# Patient Record
Sex: Female | Born: 1990 | Race: White | Hispanic: No | Marital: Married | Smoking: Never smoker
Health system: Southern US, Community
[De-identification: ages and names within clinical notes are randomized; demographics above are authoritative.]

## PROBLEM LIST (undated history)

## (undated) DIAGNOSIS — E119 Type 2 diabetes mellitus without complications: Secondary | ICD-10-CM

---

## 2018-12-30 ENCOUNTER — Other Ambulatory Visit: Payer: Self-pay

## 2018-12-30 ENCOUNTER — Inpatient Hospital Stay (HOSPITAL_COMMUNITY)
Admission: EM | Admit: 2018-12-30 | Discharge: 2018-12-31 | DRG: 639 | Disposition: A | Discharge status: Home / Self Care | Payer: 59 | Attending: Internal Medicine | Admitting: Internal Medicine

## 2018-12-30 ENCOUNTER — Encounter (HOSPITAL_COMMUNITY): Payer: Self-pay | Admitting: Emergency Medicine

## 2018-12-30 ENCOUNTER — Emergency Department (HOSPITAL_COMMUNITY): Payer: 59

## 2018-12-30 DIAGNOSIS — D72829 Elevated white blood cell count, unspecified: Secondary | ICD-10-CM

## 2018-12-30 DIAGNOSIS — Z9641 Presence of insulin pump (external) (internal): Secondary | ICD-10-CM | POA: Diagnosis present

## 2018-12-30 DIAGNOSIS — E1010 Type 1 diabetes mellitus with ketoacidosis without coma: Secondary | ICD-10-CM | POA: Diagnosis present

## 2018-12-30 DIAGNOSIS — Z794 Long term (current) use of insulin: Secondary | ICD-10-CM

## 2018-12-30 DIAGNOSIS — E860 Dehydration: Secondary | ICD-10-CM | POA: Diagnosis present

## 2018-12-30 DIAGNOSIS — Z20828 Contact with and (suspected) exposure to other viral communicable diseases: Secondary | ICD-10-CM | POA: Diagnosis present

## 2018-12-30 DIAGNOSIS — Z79899 Other long term (current) drug therapy: Secondary | ICD-10-CM | POA: Diagnosis not present

## 2018-12-30 DIAGNOSIS — N289 Disorder of kidney and ureter, unspecified: Secondary | ICD-10-CM

## 2018-12-30 DIAGNOSIS — R112 Nausea with vomiting, unspecified: Secondary | ICD-10-CM

## 2018-12-30 DIAGNOSIS — E0810 Diabetes mellitus due to underlying condition with ketoacidosis without coma: Secondary | ICD-10-CM

## 2018-12-30 HISTORY — DX: Type 2 diabetes mellitus without complications: E11.9

## 2018-12-30 LAB — GLUCOSE, CAPILLARY
Glucose-Capillary: 211 mg/dL — ABNORMAL HIGH (ref 70–99)
Glucose-Capillary: 186 mg/dL — ABNORMAL HIGH (ref 70–99)
Glucose-Capillary: 179 mg/dL — ABNORMAL HIGH (ref 70–99)

## 2018-12-30 LAB — CBC
HCT: 43 % (ref 36.0–46.0)
HCT: 39.3 % (ref 36.0–46.0)
Hemoglobin: 13.8 g/dL (ref 12.0–15.0)
Hemoglobin: 12.7 g/dL (ref 12.0–15.0)
MCH: 30.5 pg (ref 26.0–34.0)
MCH: 30.5 pg (ref 26.0–34.0)
MCHC: 32.1 g/dL (ref 30.0–36.0)
MCHC: 32.3 g/dL (ref 30.0–36.0)
MCV: 94.9 fL (ref 80.0–100.0)
MCV: 94.5 fL (ref 80.0–100.0)
Platelets: 303 10*3/uL (ref 150–400)
Platelets: 287 10*3/uL (ref 150–400)
RBC: 4.53 MIL/uL (ref 3.87–5.11)
RBC: 4.16 MIL/uL (ref 3.87–5.11)
RDW: 12.3 % (ref 11.5–15.5)
RDW: 12.3 % (ref 11.5–15.5)
WBC: 17.9 10*3/uL — ABNORMAL HIGH (ref 4.0–10.5)
WBC: 16.1 10*3/uL — ABNORMAL HIGH (ref 4.0–10.5)
nRBC: 0 % (ref 0.0–0.2)
nRBC: 0 % (ref 0.0–0.2)

## 2018-12-30 LAB — BASIC METABOLIC PANEL
Anion gap: 16 — ABNORMAL HIGH (ref 5–15)
Anion gap: 12 (ref 5–15)
BUN: 16 mg/dL (ref 6–20)
BUN: 16 mg/dL (ref 6–20)
CO2: 14 mmol/L — ABNORMAL LOW (ref 22–32)
CO2: 16 mmol/L — ABNORMAL LOW (ref 22–32)
Calcium: 8.5 mg/dL — ABNORMAL LOW (ref 8.9–10.3)
Calcium: 8.3 mg/dL — ABNORMAL LOW (ref 8.9–10.3)
Chloride: 106 mmol/L (ref 98–111)
Chloride: 108 mmol/L (ref 98–111)
Creatinine, Ser: 0.93 mg/dL (ref 0.44–1.00)
Creatinine, Ser: 0.85 mg/dL (ref 0.44–1.00)
GFR calc Af Amer: >60 mL/min (ref >60)
GFR calc Af Amer: >60 mL/min (ref >60)
GFR calc non Af Amer: >60 mL/min (ref >60)
GFR calc non Af Amer: >60 mL/min (ref >60)
Glucose, Bld: 216 mg/dL — ABNORMAL HIGH (ref 70–99)
Glucose, Bld: 197 mg/dL — ABNORMAL HIGH (ref 70–99)
Potassium: 4.1 mmol/L (ref 3.5–5.1)
Potassium: 3.9 mmol/L (ref 3.5–5.1)
Sodium: 136 mmol/L (ref 135–145)
Sodium: 136 mmol/L (ref 135–145)

## 2018-12-30 LAB — URINALYSIS, ROUTINE W REFLEX MICROSCOPIC
Bilirubin Urine: NEGATIVE
Glucose, UA: >=500 mg/dL — AB
Hgb urine dipstick: NEGATIVE
Ketones, ur: 80 mg/dL — AB
Leukocytes,Ua: NEGATIVE
Nitrite: NEGATIVE
Protein, ur: NEGATIVE mg/dL
Specific Gravity, Urine: 1.022 (ref 1.005–1.030)
pH: 5 (ref 5.0–8.0)

## 2018-12-30 LAB — COMPREHENSIVE METABOLIC PANEL
ALT: 35 U/L (ref 0–44)
AST: 39 U/L (ref 15–41)
Albumin: 4.8 g/dL (ref 3.5–5.0)
Alkaline Phosphatase: 81 U/L (ref 38–126)
Anion gap: 23 — ABNORMAL HIGH (ref 5–15)
BUN: 22 mg/dL — ABNORMAL HIGH (ref 6–20)
CO2: 13 mmol/L — ABNORMAL LOW (ref 22–32)
Calcium: 9.2 mg/dL (ref 8.9–10.3)
Chloride: 98 mmol/L (ref 98–111)
Creatinine, Ser: 1.3 mg/dL — ABNORMAL HIGH (ref 0.44–1.00)
GFR calc Af Amer: >60 mL/min (ref >60)
GFR calc non Af Amer: 56 mL/min — ABNORMAL LOW (ref >60)
Glucose, Bld: 471 mg/dL — ABNORMAL HIGH (ref 70–99)
Potassium: 4.8 mmol/L (ref 3.5–5.1)
Sodium: 134 mmol/L — ABNORMAL LOW (ref 135–145)
Total Bilirubin: 2 mg/dL — ABNORMAL HIGH (ref 0.3–1.2)
Total Protein: 8.5 g/dL — ABNORMAL HIGH (ref 6.5–8.1)

## 2018-12-30 LAB — I-STAT BETA HCG BLOOD, ED (MC, WL, AP ONLY): I-stat hCG, quantitative: <5 m[IU]/mL (ref <5)

## 2018-12-30 LAB — CBG MONITORING, ED
Glucose-Capillary: 569 mg/dL (ref 70–99)
Glucose-Capillary: 296 mg/dL — ABNORMAL HIGH (ref 70–99)
Glucose-Capillary: 313 mg/dL — ABNORMAL HIGH (ref 70–99)

## 2018-12-30 LAB — MRSA PCR SCREENING: MRSA by PCR: NEGATIVE

## 2018-12-30 MED ORDER — SODIUM CHLORIDE 0.9 % IV SOLN: INTRAVENOUS | Status: DC

## 2018-12-30 MED ORDER — SODIUM CHLORIDE 0.9 % IV BOLUS
2000.0000 mL | Freq: Once | INTRAVENOUS | Status: AC
  Administered 2018-12-30: 2000 mL via INTRAVENOUS

## 2018-12-30 MED ORDER — CHLORHEXIDINE GLUCONATE CLOTH 2 % EX PADS
6.0000 | MEDICATED_PAD | Freq: Every day | CUTANEOUS | Status: DC
  Administered 2018-12-30: 6 via TOPICAL

## 2018-12-30 MED ORDER — ONDANSETRON HCL 4 MG/2ML IJ SOLN
4.0000 mg | Freq: Once | INTRAMUSCULAR | Status: AC
  Administered 2018-12-30: 4 mg via INTRAVENOUS
  Filled 2018-12-30: qty 2

## 2018-12-30 MED ORDER — DEXTROSE 50 % IV SOLN: 25.0000 mL | INTRAVENOUS | Status: DC | PRN

## 2018-12-30 MED ORDER — HYDROCODONE-ACETAMINOPHEN 5-325 MG PO TABS: 1.0000 | ORAL_TABLET | ORAL | Status: DC | PRN

## 2018-12-30 MED ORDER — DEXTROSE-NACL 5-0.45 % IV SOLN
INTRAVENOUS | Status: DC
  Administered 2018-12-30: 20:00:00 via INTRAVENOUS

## 2018-12-30 MED ORDER — SODIUM CHLORIDE 0.9 % IV SOLN: INTRAVENOUS | Status: AC

## 2018-12-30 MED ORDER — ONDANSETRON HCL 4 MG/2ML IJ SOLN: 4.0000 mg | Freq: Four times a day (QID) | INTRAMUSCULAR | Status: DC | PRN

## 2018-12-30 MED ORDER — ORAL CARE MOUTH RINSE
15.0000 mL | Freq: Two times a day (BID) | OROMUCOSAL | Status: DC
  Administered 2018-12-30 – 2018-12-31 (×2): 15 mL via OROMUCOSAL

## 2018-12-30 MED ORDER — INSULIN REGULAR(HUMAN) IN NACL 100-0.9 UT/100ML-% IV SOLN
INTRAVENOUS | Status: DC
  Administered 2018-12-30: 2.5 [IU]/h via INTRAVENOUS
  Filled 2018-12-30: qty 100

## 2018-12-30 MED ORDER — INSULIN REGULAR BOLUS VIA INFUSION
0.0000 [IU] | Freq: Three times a day (TID) | INTRAVENOUS | Status: DC
  Filled 2018-12-30: qty 10

## 2018-12-30 MED ORDER — ESCITALOPRAM OXALATE 10 MG PO TABS
20.0000 mg | ORAL_TABLET | Freq: Every day | ORAL | Status: DC
  Administered 2018-12-31: 20 mg via ORAL
  Filled 2018-12-30: qty 2

## 2018-12-30 MED ORDER — ONDANSETRON HCL 4 MG PO TABS: 4.0000 mg | ORAL_TABLET | Freq: Four times a day (QID) | ORAL | Status: DC | PRN

## 2018-12-30 MED ORDER — HEPARIN SODIUM (PORCINE) 5000 UNIT/ML IJ SOLN
5000.0000 [IU] | Freq: Three times a day (TID) | INTRAMUSCULAR | Status: DC
  Administered 2018-12-31 (×2): 5000 [IU] via SUBCUTANEOUS
  Filled 2018-12-30 (×2): qty 1

## 2018-12-30 NOTE — ED Triage Notes (Signed)
Pt reports started feeling bad around 5am. Been vomiting, feeling weak, is DM type 1. Reports went to UC and was told to go to ED for possible DKA-sugar was 400s. Pt has insulin pump. Also having headache and back pains.

## 2018-12-30 NOTE — ED Notes (Signed)
Below order not completed by EW. 

## 2018-12-30 NOTE — ED Notes (Signed)
Patient transported to X-ray 

## 2018-12-30 NOTE — ED Provider Notes (Signed)
[place] DEPT Provider Note   CSN: [id number] Arrival date & time: 12/30/18  1451     History   Chief Complaint Chief Complaint  Patient presents with   Chills   Headache   Back Pain    HPI Kendra Tate is a 28 y.o. female.     Patient presents with vomiting and weakness.  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  This started today   Weakness Severity:  Moderate Onset quality:  Sudden Timing:  Constant Chronicity:  New Context: dehydration   Context: not alcohol use   Relieved by:  Nothing Worsened by:  Nothing Ineffective treatments:  None tried Associated symptoms: anorexia and vomiting   Associated symptoms: no abdominal pain, no chest pain, no cough, no diarrhea, no frequency, no headaches and no seizures     Past Medical History:  Diagnosis Date   Diabetes mellitus without complication [facility])     Patient Active Problem List   Diagnosis Date Noted   DKA (diabetic ketoacidoses) ([place]) 12/30/2018    Past Surgical History:  Procedure Laterality Date   CESAREAN SECTION       OB History   No obstetric history on file.      Home Medications    Prior to Admission medications   Medication Sig Start Date End Date Taking? Authorizing Provider  amoxicillin-clavulanate (AUGMENTIN) 500-125 MG tablet Take 1 tablet by mouth 2 (two) times daily. 12/26/18 01/05/19 Yes [provider]  escitalopram (LEXAPRO) 20 MG tablet Take 20 mg by mouth daily. 07/27/18  Yes [provider]  glucagon 1 MG injection Inject 1 mg into the skin as needed. 09/04/15  Yes [provider]  insulin lispro (HUMALOG) 100 UNIT/ML injection Inject 70 Units into the skin daily. Insulin pump 09/20/15  Yes [provider]  Insulin Glargine, 1 Unit Dial, (TOUJEO SOLOSTAR) 300 UNIT/ML SOPN Inject 22 Units into the skin 2 (two) times daily as needed. Only use when Insulin Pump fails. 11/11/18   [provider]      Family History No family history on file.  Social History Social History   Tobacco Use   Smoking status: Not on file  Substance Use Topics   Alcohol use: Not on file   Drug use: Not on file     Allergies   Patient has no allergy information on record.   Review of Systems Review of Systems  Constitutional: Negative for appetite change and fatigue.  HENT: Negative for congestion, ear discharge and sinus pressure.   Eyes: Negative for discharge.  Respiratory: Negative for cough.   Cardiovascular: Negative for chest pain.  Gastrointestinal: Positive for anorexia and vomiting. Negative for abdominal pain and diarrhea.  Genitourinary: Negative for frequency and hematuria.  Musculoskeletal: Negative for back pain.  Skin: Negative for rash.  Neurological: Positive for weakness. Negative for seizures and headaches.  Psychiatric/Behavioral: Negative for hallucinations.     Physical Exam Updated Vital Signs BP 123/73 (BP Location: Left Arm)    Pulse 100    Temp 98.6 F (37 C) (Oral)    Resp 20    Ht 5\' 3"  (1.6 m)    Wt 72.6 kg    LMP 12/15/2018    SpO2 99%    BMI 28.34 kg/m   Physical Exam Vitals signs and nursing note reviewed.  Constitutional:      Appearance: She is well-developed.  HENT:     Head: Normocephalic.     Nose: Nose normal.  Eyes:     General: No scleral icterus.    Conjunctiva/sclera: Conjunctivae normal.  Neck:     Musculoskeletal: Neck supple.     Thyroid: No thyromegaly.  Cardiovascular:     Rate and Rhythm: Normal rate and regular rhythm.     Heart sounds: No murmur. No friction rub. No gallop.   Pulmonary:     Breath sounds: No stridor. No wheezing or rales.  Chest:     Chest wall: No tenderness.  Abdominal:     General: There is no distension.     Tenderness: There is no abdominal tenderness. There is no rebound.  Musculoskeletal: Normal range of motion.  Lymphadenopathy:     Cervical: No cervical adenopathy.  Skin:    Findings:  No erythema or rash.  Neurological:     Mental Status: She is oriented to person, place, and time.     Motor: No abnormal muscle tone.     Coordination: Coordination normal.  Psychiatric:        Behavior: Behavior normal.      ED Treatments / Results  Labs (all labs ordered are listed, but only abnormal results are displayed) Labs Reviewed  CBC - Abnormal; Notable for the following components:      Result Value   WBC 17.9 (*)    All other components within normal limits  URINALYSIS, ROUTINE W REFLEX MICROSCOPIC - Abnormal; Notable for the following components:   Color, Urine STRAW (*)    Glucose, UA >=500 (*)    Ketones, ur 80 (*)    Bacteria, UA RARE (*)    All other components within normal limits  COMPREHENSIVE METABOLIC PANEL - Abnormal; Notable for the following components:   Sodium 134 (*)    CO2 13 (*)    Glucose, Bld 471 (*)    BUN 22 (*)    Creatinine, Ser 1.30 (*)    Total Protein 8.5 (*)    Total Bilirubin 2.0 (*)    GFR calc non Af Amer 56 (*)    Anion gap 23 (*)    All other components within normal limits  CBG MONITORING, ED - Abnormal; Notable for the following components:   Glucose-Capillary 569 (*)    All other components within normal limits  I-STAT BETA HCG BLOOD, ED ([facility], [facility], [facility] ONLY)  I-STAT CHEM 8, ED    EKG EKG Interpretation  Date/Time:  Thursday December 30 2018 15:10:30 EST Ventricular Rate:  118 PR Interval:    QRS Duration: 84 QT Interval:  345 QTC Calculation: 484 R Axis:   93 Text Interpretation: Sinus tachycardia Borderline right axis deviation Borderline prolonged QT interval Baseline wander in lead(s) V6 Confirmed by Geoffery Lyons ([id number]) on 12/30/2018 3:35:37 PM   Radiology No results found.  Procedures Procedures (including critical care time)  Medications Ordered in ED Medications  dextrose 5 %-0.45 % sodium chloride infusion (has no administration in time range)  insulin regular bolus via infusion 0-10 Units (has no  administration in time range)  insulin regular, human (MYXREDLIN) 100 units/ 100 mL infusion (has no administration in time range)  dextrose 50 % solution 25 mL (has no administration in time range)  0.9 %  sodium chloride infusion (has no administration in time range)  sodium chloride 0.9 % bolus 2,000 mL (2,000 mLs Intravenous New Bag/Given 12/30/18 1622)  ondansetron (ZOFRAN) injection 4 mg (4 mg Intravenous Given 12/30/18 1623)     Initial Impression / Assessment and Plan / ED Course  I have reviewed the triage vital signs and the nursing notes.  Pertinent labs & imaging results that were available during my care of the patient were reviewed by me and considered in my medical decision making (see chart for details).        CRITICAL CARE Performed by: Bethann BerkshireJoseph Ondine Gemme Total critical care time: 45 minutes Critical care time was exclusive of separately billable procedures and treating other patients. Critical care was necessary to treat or prevent imminent or life-threatening deterioration. Critical care was time spent personally by me on the following activities: development of treatment plan with patient and/or surrogate as well as nursing, discussions with consultants, evaluation of patient's response to treatment, examination of patient, obtaining history from patient or surrogate, ordering and performing treatments and interventions, ordering and review of laboratory studies, ordering and review of radiographic studies, pulse oximetry and re-evaluation of patient's condition. Patient is in DKA.  We will start her on an insulin drip and Glucomander.  She will be admitted to medicine  Final Clinical Impressions(s) / ED Diagnoses   Final diagnoses:  None    ED Discharge Orders    None       Bethann BerkshireZammit, Timo Hartwig, MD 12/30/18 1746

## 2018-12-30 NOTE — ED Notes (Signed)
Called report to nurse Ubaldo Glassing for pt tranfer upstairs to ICU.

## 2018-12-30 NOTE — H&P (Signed)
History and Physical    Kendra Tate YNW:[id number] DOB: 1990-04-23 DOA: 12/30/2018  PCP: Patient, No Pcp Per   Chief Complaint: Nausea with vomiting  HPI: Kendra Tate is a 28 y.o. female with medical history significant of diabetes mellitus type 1 who presented to the emergency room with multiple episodes of nausea and vomiting.  She says that her symptoms started this morning.  She had some mild epigastric discomfort when she was vomiting.  Denies having any blood in the vomitus.  Denies having any fevers, chest pain, shortness of breath, abdominal pain, diarrhea or dysuria.  She works as an Neurosurgeon person.  She is also having some generalized weakness with no focal deficits. She says last week she had some mild upper respiratory tract symptoms which she thinks has resolved.  She is on insulin pump.  She does not think that her pump malfunctioned. -Covid test pending at this time.  ED Course: Lab work in the ED showed leukocytosis 17.9.  She was afebrile.  Blood glucose 471 with anion gap of 23.  She also had acute renal insufficiency with BUN 22 and creatinine 1.30.  Baseline unknown at this time.  Review of Systems: As per HPI otherwise 10 point review of systems negative.    Past Medical History:  Diagnosis Date  . Diabetes mellitus without complication [facility])     Past Surgical History:  Procedure Laterality Date  . CESAREAN SECTION      Social history: She denies having any history of tobacco abuse or drug abuse.  Occasional alcohol.  Family history: H/o DM   Prior to Admission medications   Medication Sig Start Date End Date Taking? Authorizing Provider  amoxicillin-clavulanate (AUGMENTIN) 500-125 MG tablet Take 1 tablet by mouth 2 (two) times daily. 12/26/18 01/05/19 Yes [provider]  escitalopram (LEXAPRO) 20 MG tablet Take 20 mg by mouth daily. 07/27/18  Yes [provider]  glucagon 1 MG injection Inject 1 mg into the skin as needed. 09/04/15  Yes  [provider]  insulin lispro (HUMALOG) 100 UNIT/ML injection Inject 70 Units into the skin daily. Insulin pump 09/20/15  Yes [provider]  Insulin Glargine, 1 Unit Dial, (TOUJEO SOLOSTAR) 300 UNIT/ML SOPN Inject 22 Units into the skin 2 (two) times daily as needed. Only use when Insulin Pump fails. 11/11/18   [provider]    Physical Exam: Vitals:   12/30/18 1605 12/30/18 1839  BP: 123/73 (!) 110/54  Pulse: 100 94  Resp: 20 20  Temp: 98.6 F (37 C)   TempSrc: Oral   SpO2: 99% 100%  Weight: 72.6 kg   Height: 5\' 3"  (1.6 m)     Vitals:   12/30/18 1605 12/30/18 1839  BP: 123/73 (!) 110/54  Pulse: 100 94  Resp: 20 20  Temp: 98.6 F (37 C)   TempSrc: Oral   SpO2: 99% 100%  Weight: 72.6 kg   Height: 5\' 3"  (1.6 m)    Constitutional: Complaining of nausea.  Not in any acute distress at this time. Eyes: PERRL, lids and conjunctivae normal ENMT: Dry mucous membranes Neck: normal, supple, no masses, no thyromegaly Respiratory: clear to auscultation bilaterally, no wheezing, no crackles. Normal respiratory effort. No accessory muscle use.  Cardiovascular: Regular rate and rhythm, no murmurs / rubs / gallops. No extremity edema.  Abdomen: Soft, nondistended, no tenderness. Bowel sounds positive.  Musculoskeletal: No joint deformity upper and lower extremities. Good ROM, no contractures. Normal muscle tone.  Skin: no rashes Neurologic: She  has some generalized weakness otherwise grossly nonfocal. Strength 5/5 in all 4.  Psychiatric: Normal judgment and insight. Alert and oriented x 3.   Labs on Admission: I have personally reviewed following labs and imaging studies  CBC: Recent Labs  Lab 12/30/18 1605  WBC 17.9*  HGB 13.8  HCT 43.0  MCV 94.9  PLT 303   Basic Metabolic Panel: Recent Labs  Lab 12/30/18 1605  NA 134*  K 4.8  CL 98  CO2 13*  GLUCOSE 471*  BUN 22*  CREATININE 1.30*  CALCIUM 9.2   GFR: Estimated Creatinine Clearance:  61.5 mL/min (A) (by C-G formula based on SCr of 1.3 mg/dL (H)). Liver Function Tests: Recent Labs  Lab 12/30/18 1605  AST 39  ALT 35  ALKPHOS 81  BILITOT 2.0*  PROT 8.5*  ALBUMIN 4.8   No results for input(s): LIPASE, AMYLASE in the last 168 hours. No results for input(s): AMMONIA in the last 168 hours. Coagulation Profile: No results for input(s): INR, PROTIME in the last 168 hours. Cardiac Enzymes: No results for input(s): CKTOTAL, CKMB, CKMBINDEX, TROPONINI in the last 168 hours. BNP (last 3 results) No results for input(s): PROBNP in the last 8760 hours. HbA1C: No results for input(s): HGBA1C in the last 72 hours. CBG: Recent Labs  Lab 12/30/18 1506 12/30/18 1742 12/30/18 1823  GLUCAP 569* 313* 296*   Lipid Profile: No results for input(s): CHOL, HDL, LDLCALC, TRIG, CHOLHDL, LDLDIRECT in the last 72 hours. Thyroid Function Tests: No results for input(s): TSH, T4TOTAL, FREET4, T3FREE, THYROIDAB in the last 72 hours. Anemia Panel: No results for input(s): VITAMINB12, FOLATE, FERRITIN, TIBC, IRON, RETICCTPCT in the last 72 hours. Urine analysis:    Component Value Date/Time   COLORURINE STRAW (A) 12/30/2018 1613   APPEARANCEUR CLEAR 12/30/2018 1613   LABSPEC 1.022 12/30/2018 1613   PHURINE 5.0 12/30/2018 1613   GLUCOSEU >=500 (A) 12/30/2018 1613   HGBUR NEGATIVE 12/30/2018 1613   BILIRUBINUR NEGATIVE 12/30/2018 1613   KETONESUR 80 (A) 12/30/2018 1613   PROTEINUR NEGATIVE 12/30/2018 1613   NITRITE NEGATIVE 12/30/2018 1613   LEUKOCYTESUR NEGATIVE 12/30/2018 1613    Radiological Exams on Admission: Dg Chest 2 View  Result Date: 12/30/2018 CLINICAL DATA:  Chest pain with cough EXAM: CHEST - 2 VIEW COMPARISON:  None. FINDINGS: The heart size and mediastinal contours are within normal limits. Both lungs are clear. The visualized skeletal structures are unremarkable. IMPRESSION: No active cardiopulmonary disease. Electronically Signed   By: Katherine Mantle M.D.    On: 12/30/2018 17:40    Assessment/Plan Principal Problem:   Diabetic ketoacidosis without coma associated with type 1 diabetes mellitus (HCC) Active Problems:   Nausea with vomiting   Leukocytosis   Acute renal insufficiency   Diabetic ketoacidosis:  - Patient found to have elevated blood glucose of 471 and anion gap of 23. -She is a type I diabetic on insulin pump.  As far as she knows she thinks her pump seems to be working well. -Exact etiology for the ketoacidosis is unclear.  She says last week she was diagnosed with upper respiratory tract infection which she says has now improved. -She has been started on insulin drip in the ER which we will continue. -Monitor BMP every 6 hours until gap closed. -Once anion gap closes she can be transitioned to her basal insulin.  Nausea with vomiting: -Patient reports intractable nausea with vomiting.  However, denies having any abdominal pain or diarrhea.  Denies having any blood in her vomitus. -  The nausea and vomiting likely secondary to diabetic ketoacidosis.  Abdominal exam is benign. -As needed nausea medications.  Hopefully once her blood glucose is better controlled and her anion gap has closed symptoms likely will resolve. -If persistent despite correction of blood glucose and anion gap closed then consider other etiology.  Leukocytosis: -Exact etiology for the leukocytosis unclear at this time.  UA does not show any evidence of UTI. -Chest x-ray does not show any acute findings.  She denies having any other symptoms. -Physical examination benign.  At this point likely reactive from the DKA. -Continue to monitor.  If she starts having any fevers or worsening leukocytosis then consider starting empiric antibiotics.  Acute renal insufficiency: -Likely prerenal secondary to vomiting/volume loss. - Will start IV hydration.  Baseline creatinine unknown at this time.  Continue to monitor.  Avoid nephrotoxic medications.  DVT  prophylaxis: Subcutaneous heparin  Code Status: Full code Family Communication: Discussed with husband at the bedside Disposition Plan: Home when stable Consults called: None Admission status: SDU  Severity of Illness: The appropriate patient status for this patient is INPATIENT. Inpatient status is judged to be reasonable and necessary in order to provide the required intensity of service to ensure the patient's safety. The patient's presenting symptoms, physical exam findings, and initial radiographic and laboratory data in the context of their chronic comorbidities is felt to place them at high risk for further clinical deterioration. Furthermore, it is not anticipated that the patient will be medically stable for discharge from the hospital within 2 midnights of admission. The following factors support the patient status of inpatient.   The patient's presenting symptoms include intractable nausea/vomiting, diabetic ketoacidosis. I certify that at the point of admission it is my clinical judgment that the patient will require inpatient hospital care spanning beyond 2 midnights from the point of admission due to high intensity of service, high risk for further deterioration and high frequency of surveillance required.   Vonzella NippleAnupama Aniruddh Ciavarella MD [facility] Pager on amion  If 7PM-7AM, please contact night-coverage [email or web address] Password [email or web address]  12/30/2018, 6:40 PM

## 2018-12-31 ENCOUNTER — Encounter (HOSPITAL_COMMUNITY): Payer: Self-pay

## 2018-12-31 DIAGNOSIS — E1010 Type 1 diabetes mellitus with ketoacidosis without coma: Principal | ICD-10-CM

## 2018-12-31 LAB — BASIC METABOLIC PANEL
Anion gap: 13 (ref 5–15)
Anion gap: 11 (ref 5–15)
Anion gap: 10 (ref 5–15)
Anion gap: 8 (ref 5–15)
BUN: 15 mg/dL (ref 6–20)
BUN: 15 mg/dL (ref 6–20)
BUN: 17 mg/dL (ref 6–20)
BUN: 19 mg/dL (ref 6–20)
CO2: 18 mmol/L — ABNORMAL LOW (ref 22–32)
CO2: 19 mmol/L — ABNORMAL LOW (ref 22–32)
CO2: 19 mmol/L — ABNORMAL LOW (ref 22–32)
CO2: 19 mmol/L — ABNORMAL LOW (ref 22–32)
Calcium: 8.6 mg/dL — ABNORMAL LOW (ref 8.9–10.3)
Calcium: 8.4 mg/dL — ABNORMAL LOW (ref 8.9–10.3)
Calcium: 8.5 mg/dL — ABNORMAL LOW (ref 8.9–10.3)
Calcium: 7.9 mg/dL — ABNORMAL LOW (ref 8.9–10.3)
Chloride: 107 mmol/L (ref 98–111)
Chloride: 107 mmol/L (ref 98–111)
Chloride: 105 mmol/L (ref 98–111)
Chloride: 108 mmol/L (ref 98–111)
Creatinine, Ser: 0.92 mg/dL (ref 0.44–1.00)
Creatinine, Ser: 1.01 mg/dL — ABNORMAL HIGH (ref 0.44–1.00)
Creatinine, Ser: 0.92 mg/dL (ref 0.44–1.00)
Creatinine, Ser: 0.83 mg/dL (ref 0.44–1.00)
GFR calc Af Amer: >60 mL/min (ref >60)
GFR calc Af Amer: >60 mL/min (ref >60)
GFR calc Af Amer: >60 mL/min (ref >60)
GFR calc Af Amer: >60 mL/min (ref >60)
GFR calc non Af Amer: >60 mL/min (ref >60)
GFR calc non Af Amer: >60 mL/min (ref >60)
GFR calc non Af Amer: >60 mL/min (ref >60)
GFR calc non Af Amer: >60 mL/min (ref >60)
Glucose, Bld: 174 mg/dL — ABNORMAL HIGH (ref 70–99)
Glucose, Bld: 191 mg/dL — ABNORMAL HIGH (ref 70–99)
Glucose, Bld: 236 mg/dL — ABNORMAL HIGH (ref 70–99)
Glucose, Bld: 265 mg/dL — ABNORMAL HIGH (ref 70–99)
Potassium: 4.2 mmol/L (ref 3.5–5.1)
Potassium: 3.6 mmol/L (ref 3.5–5.1)
Potassium: 3.7 mmol/L (ref 3.5–5.1)
Potassium: 3.8 mmol/L (ref 3.5–5.1)
Sodium: 138 mmol/L (ref 135–145)
Sodium: 137 mmol/L (ref 135–145)
Sodium: 134 mmol/L — ABNORMAL LOW (ref 135–145)
Sodium: 135 mmol/L (ref 135–145)

## 2018-12-31 LAB — CBC
HCT: 38.1 % (ref 36.0–46.0)
Hemoglobin: 12.5 g/dL (ref 12.0–15.0)
MCH: 30.3 pg (ref 26.0–34.0)
MCHC: 32.8 g/dL (ref 30.0–36.0)
MCV: 92.3 fL (ref 80.0–100.0)
Platelets: 287 10*3/uL (ref 150–400)
RBC: 4.13 MIL/uL (ref 3.87–5.11)
RDW: 12.6 % (ref 11.5–15.5)
WBC: 14.1 10*3/uL — ABNORMAL HIGH (ref 4.0–10.5)
nRBC: 0 % (ref 0.0–0.2)

## 2018-12-31 LAB — GLUCOSE, CAPILLARY
Glucose-Capillary: 137 mg/dL — ABNORMAL HIGH (ref 70–99)
Glucose-Capillary: 138 mg/dL — ABNORMAL HIGH (ref 70–99)
Glucose-Capillary: 152 mg/dL — ABNORMAL HIGH (ref 70–99)
Glucose-Capillary: 156 mg/dL — ABNORMAL HIGH (ref 70–99)
Glucose-Capillary: 160 mg/dL — ABNORMAL HIGH (ref 70–99)
Glucose-Capillary: 175 mg/dL — ABNORMAL HIGH (ref 70–99)
Glucose-Capillary: 176 mg/dL — ABNORMAL HIGH (ref 70–99)
Glucose-Capillary: 177 mg/dL — ABNORMAL HIGH (ref 70–99)
Glucose-Capillary: 201 mg/dL — ABNORMAL HIGH (ref 70–99)
Glucose-Capillary: 207 mg/dL — ABNORMAL HIGH (ref 70–99)
Glucose-Capillary: 217 mg/dL — ABNORMAL HIGH (ref 70–99)
Glucose-Capillary: 240 mg/dL — ABNORMAL HIGH (ref 70–99)
Glucose-Capillary: 246 mg/dL — ABNORMAL HIGH (ref 70–99)

## 2018-12-31 LAB — HEMOGLOBIN A1C
Hgb A1c MFr Bld: 11 % — ABNORMAL HIGH (ref 4.8–5.6)
Mean Plasma Glucose: 269 mg/dL

## 2018-12-31 LAB — HIV ANTIBODY (ROUTINE TESTING W REFLEX): HIV Screen 4th Generation wRfx: NONREACTIVE

## 2018-12-31 LAB — SARS CORONAVIRUS 2 (TAT 6-24 HRS): SARS Coronavirus 2: NEGATIVE

## 2018-12-31 MED ORDER — INSULIN ASPART 100 UNIT/ML ~~LOC~~ SOLN
0.0000 [IU] | Freq: Three times a day (TID) | SUBCUTANEOUS | Status: DC
  Administered 2018-12-31: 12:00:00 3 [IU] via SUBCUTANEOUS

## 2018-12-31 MED ORDER — SODIUM CHLORIDE 0.9 % IV BOLUS
1000.0000 mL | Freq: Once | INTRAVENOUS | Status: AC
  Administered 2018-12-31: 1000 mL via INTRAVENOUS

## 2018-12-31 MED ORDER — INSULIN GLARGINE 100 UNIT/ML ~~LOC~~ SOLN
20.0000 [IU] | Freq: Once | SUBCUTANEOUS | Status: AC
  Administered 2018-12-31: 20 [IU] via SUBCUTANEOUS
  Filled 2018-12-31: qty 0.2

## 2018-12-31 MED ORDER — INSULIN ASPART 100 UNIT/ML ~~LOC~~ SOLN: 0.0000 [IU] | Freq: Every day | SUBCUTANEOUS | Status: DC

## 2018-12-31 MED ORDER — SODIUM CHLORIDE 0.9% FLUSH: 10.0000 mL | INTRAVENOUS | Status: DC | PRN

## 2018-12-31 MED ORDER — INSULIN ASPART 100 UNIT/ML ~~LOC~~ SOLN: 0.0000 [IU] | SUBCUTANEOUS | Status: DC

## 2018-12-31 MED ORDER — INSULIN GLARGINE 100 UNIT/ML ~~LOC~~ SOLN
40.0000 [IU] | Freq: Every day | SUBCUTANEOUS | Status: DC
  Filled 2018-12-31: qty 0.4

## 2018-12-31 MED ORDER — SODIUM CHLORIDE 0.9 % IV SOLN
INTRAVENOUS | Status: AC
  Administered 2018-12-31: 11:00:00 via INTRAVENOUS

## 2018-12-31 MED ORDER — INSULIN ASPART 100 UNIT/ML ~~LOC~~ SOLN
3.0000 [IU] | Freq: Three times a day (TID) | SUBCUTANEOUS | Status: DC
  Administered 2018-12-31: 3 [IU] via SUBCUTANEOUS

## 2018-12-31 MED ORDER — INSULIN ASPART 100 UNIT/ML ~~LOC~~ SOLN
10.0000 [IU] | Freq: Once | SUBCUTANEOUS | Status: AC
  Administered 2018-12-31: 10 [IU] via SUBCUTANEOUS

## 2018-12-31 MED ORDER — SODIUM CHLORIDE 0.9% FLUSH
10.0000 mL | Freq: Two times a day (BID) | INTRAVENOUS | Status: DC
  Administered 2018-12-31: 10 mL

## 2018-12-31 NOTE — Discharge Summary (Signed)
Physician Discharge Summary  Kendra Tate LPF:[id number] DOB: 11/08/1990 DOA: 12/30/2018  PCP: Patient, No Pcp Per  Admit date: 12/30/2018 Discharge date: 12/31/2018  Admitted From: Home Disposition: Home  Recommendations for Outpatient Follow-up:  1. Follow up with PCP early next week. 2. Please obtain BMP/CBC in one week 3. Please follow up on the following pending results:  Home Health: No Equipment/Devices: None Discharge Condition: Stable CODE STATUS: Full Diet recommendation:  Carb Modified   Brief/Interim Summary: Kendra Tate is a 28 y.o. female with medical history significant of diabetes mellitus type 1 who presented to the emergency room with multiple episodes of nausea and vomiting. She was found to be in DKA due to malfunctioning insulin pump and continuous glucose monitor.  She was treated according to DKA protocol, initially with insulin infusion and was switched to Lantus and NovoLog once her anion gap closed x2.  Her bicarb remains at 19 on multiple checks today.  Her nausea vomiting resolved and patient really wants to be discharged home as she has a 74-year-old to take care.  Patient is well aware how to fix her monitor and insulin pump.  She was hyperglycemic in the afternoon and was given an extra dose of 10 units of NovoLog.  She was also advised to follow-up with PCP early next week for lab checks.  Discharge Diagnoses:  Principal Problem:   Diabetic ketoacidosis without coma associated with type 1 diabetes mellitus (HCC) Active Problems:   Nausea with vomiting   Leukocytosis   Acute renal insufficiency  Discharge Instructions  Discharge Instructions    Diet - low sodium heart healthy   Complete by: As directed    Discharge instructions   Complete by: As directed    It was pleasure taking care of you. As we discussed your acid level is still little high, we are sending you home according to your wishes. Keep yourself well-hydrated, monitor your blood  glucose level closely and use your insulin as directed. Follow-up with your PCP early next week for repeat blood work up.   Increase activity slowly   Complete by: As directed      Allergies as of 12/31/2018   Not on File     Medication List    TAKE these medications   amoxicillin-clavulanate 500-125 MG tablet Commonly known as: AUGMENTIN Take 1 tablet by mouth 2 (two) times daily.   escitalopram 20 MG tablet Commonly known as: LEXAPRO Take 20 mg by mouth daily.   glucagon 1 MG injection Inject 1 mg into the skin as needed.   insulin lispro 100 UNIT/ML injection Commonly known as: HUMALOG Inject 70 Units into the skin daily. Insulin pump   Toujeo SoloStar 300 UNIT/ML Sopn Generic drug: Insulin Glargine (1 Unit Dial) Inject 22 Units into the skin 2 (two) times daily as needed. Only use when Insulin Pump fails.       Not on File  Procedures/Studies: Dg Chest 2 View  Result Date: 12/30/2018 CLINICAL DATA:  Chest pain with cough EXAM: CHEST - 2 VIEW COMPARISON:  None. FINDINGS: The heart size and mediastinal contours are within normal limits. Both lungs are clear. The visualized skeletal structures are unremarkable. IMPRESSION: No active cardiopulmonary disease. Electronically Signed   By: Katherine Mantle M.D.   On: 12/30/2018 17:40     Subjective: Denies feeling better when seen this morning.  Nausea and vomiting has been resolved.  She was able to tolerate food well.  She was worried about her 45-year-old daughter and wants  to be discharged today.  Discharge Exam: Vitals:   12/31/18 1411 12/31/18 1519  BP: (!) 113/58   Pulse: 98   Resp: 16   Temp:  99.9 F (37.7 C)  SpO2: 100%    Vitals:   12/31/18 1205 12/31/18 1300 12/31/18 1411 12/31/18 1519  BP:  (!) 102/47 (!) 113/58   Pulse:  85 98   Resp:  12 16   Temp: 97.8 F (36.6 C)   99.9 F (37.7 C)  TempSrc: Oral   Oral  SpO2:  98% 100%   Weight:      Height:        General: Pt is alert, awake, not  in acute distress Cardiovascular: RRR, S1/S2 +, no rubs, no gallops Respiratory: CTA bilaterally, no wheezing, no rhonchi Abdominal: Soft, NT, ND, bowel sounds + Extremities: no edema, no cyanosis   The results of significant diagnostics from this hospitalization (including imaging, microbiology, ancillary and laboratory) are listed below for reference.     Microbiology: Recent Results (from the past 240 hour(s))  SARS CORONAVIRUS 2 (TAT 6-24 HRS) Nasopharyngeal Nasopharyngeal Swab     Status: None   Collection Time: 12/30/18  6:56 PM   Specimen: Nasopharyngeal Swab  Result Value Ref Range Status   SARS Coronavirus 2 NEGATIVE NEGATIVE Final    Comment: (NOTE) SARS-CoV-2 target nucleic acids are NOT DETECTED. The SARS-CoV-2 RNA is generally detectable in upper and lower respiratory specimens during the acute phase of infection. Negative results do not preclude SARS-CoV-2 infection, do not rule out co-infections with other pathogens, and should not be used as the sole basis for treatment or other patient management decisions. Negative results must be combined with clinical observations, patient history, and epidemiological information. The expected result is Negative. Fact Sheet for Patients: HairSlick.nohttps://www.fda.gov/media/138098/download Fact Sheet for Healthcare Providers: quierodirigir.comhttps://www.fda.gov/media/138095/download This test is not yet approved or cleared by the [place] FDA and  has been authorized for detection and/or diagnosis of SARS-CoV-2 by FDA under an Emergency Use Authorization (EUA). This EUA will remain  in effect (meaning this test can be used) for the duration of the COVID-19 declaration under Section 56 4(b)(1) of the Act, 21 U.S.C. section 360bbb-3(b)(1), unless the authorization is terminated or revoked sooner. Performed at [facility], [address] [place], [place] [address]   MRSA PCR Screening     Status: None   Collection Time: 12/30/18  7:45 PM    Specimen: Nasopharyngeal  Result Value Ref Range Status   MRSA by PCR NEGATIVE NEGATIVE Final    Comment:        The GeneXpert MRSA Assay (FDA approved for NASAL specimens only), is one component of a comprehensive MRSA colonization surveillance program. It is not intended to diagnose MRSA infection nor to guide or monitor treatment for MRSA infections. Performed at [facility], [address] [place], [place] [address]      Labs: BNP (last 3 results) No results for input(s): BNP in the last 8760 hours. Basic Metabolic Panel: Recent Labs  Lab 12/30/18 2228 12/31/18 0505 12/31/18 0842 12/31/18 1151 12/31/18 1603  NA 136 138 137 134* 135  K 3.9 4.2 3.6 3.7 3.8  CL 108 107 107 105 108  CO2 16* 18* 19* 19* 19*  GLUCOSE 197* 174* 191* 236* 265*  BUN 16 15 15 17 19   CREATININE 0.85 0.92 1.01* 0.92 0.83  CALCIUM 8.3* 8.6* 8.4* 8.5* 7.9*   Liver Function Tests: Recent Labs  Lab 12/30/18 1605  AST 39  ALT 35  ALKPHOS 81  BILITOT 2.0*  PROT 8.5*  ALBUMIN 4.8   No results for input(s): LIPASE, AMYLASE in the last 168 hours. No results for input(s): AMMONIA in the last 168 hours. CBC: Recent Labs  Lab 12/30/18 1605 12/30/18 2008 12/31/18 0505  WBC 17.9* 16.1* 14.1*  HGB 13.8 12.7 12.5  HCT 43.0 39.3 38.1  MCV 94.9 94.5 92.3  PLT 303 287 287   Cardiac Enzymes: No results for input(s): CKTOTAL, CKMB, CKMBINDEX, TROPONINI in the last 168 hours. BNP: Invalid input(s): POCBNP CBG: Recent Labs  Lab 12/31/18 0640 12/31/18 0753 12/31/18 0901 12/31/18 1004 12/31/18 1154  GLUCAP 207* 201* 177* 176* 246*   D-Dimer No results for input(s): DDIMER in the last 72 hours. Hgb A1c Recent Labs    12/31/18 0505  HGBA1C 11.0*   Lipid Profile No results for input(s): CHOL, HDL, LDLCALC, TRIG, CHOLHDL, LDLDIRECT in the last 72 hours. Thyroid function studies No results for input(s): TSH, T4TOTAL, T3FREE, THYROIDAB in the last 72  hours.  Invalid input(s): FREET3 Anemia work up No results for input(s): VITAMINB12, FOLATE, FERRITIN, TIBC, IRON, RETICCTPCT in the last 72 hours. Urinalysis    Component Value Date/Time   COLORURINE STRAW (A) 12/30/2018 1613   APPEARANCEUR CLEAR 12/30/2018 1613   LABSPEC 1.022 12/30/2018 1613   PHURINE 5.0 12/30/2018 1613   GLUCOSEU >=500 (A) 12/30/2018 1613   HGBUR NEGATIVE 12/30/2018 1613   BILIRUBINUR NEGATIVE 12/30/2018 1613   KETONESUR 80 (A) 12/30/2018 1613   PROTEINUR NEGATIVE 12/30/2018 1613   NITRITE NEGATIVE 12/30/2018 1613   LEUKOCYTESUR NEGATIVE 12/30/2018 1613   Sepsis Labs Invalid input(s): PROCALCITONIN,  WBC,  LACTICIDVEN Microbiology Recent Results (from the past 240 hour(s))  SARS CORONAVIRUS 2 (TAT 6-24 HRS) Nasopharyngeal Nasopharyngeal Swab     Status: None   Collection Time: 12/30/18  6:56 PM   Specimen: Nasopharyngeal Swab  Result Value Ref Range Status   SARS Coronavirus 2 NEGATIVE NEGATIVE Final    Comment: (NOTE) SARS-CoV-2 target nucleic acids are NOT DETECTED. The SARS-CoV-2 RNA is generally detectable in upper and lower respiratory specimens during the acute phase of infection. Negative results do not preclude SARS-CoV-2 infection, do not rule out co-infections with other pathogens, and should not be used as the sole basis for treatment or other patient management decisions. Negative results must be combined with clinical observations, patient history, and epidemiological information. The expected result is Negative. Fact Sheet for Patients: HairSlick.no Fact Sheet for Healthcare Providers: quierodirigir.com This test is not yet approved or cleared by the [place] FDA and  has been authorized for detection and/or diagnosis of SARS-CoV-2 by FDA under an Emergency Use Authorization (EUA). This EUA will remain  in effect (meaning this test can be used) for the duration of the COVID-19  declaration under Section 56 4(b)(1) of the Act, 21 U.S.C. section 360bbb-3(b)(1), unless the authorization is terminated or revoked sooner. Performed at [facility], [address] [place], [place] [address]   MRSA PCR Screening     Status: None   Collection Time: 12/30/18  7:45 PM   Specimen: Nasopharyngeal  Result Value Ref Range Status   MRSA by PCR NEGATIVE NEGATIVE Final    Comment:        The GeneXpert MRSA Assay (FDA approved for NASAL specimens only), is one component of a comprehensive MRSA colonization surveillance program. It is not intended to diagnose MRSA infection nor to guide or monitor treatment for MRSA infections.  Performed at [facility], [place] [address] [place], [place] [address]     Time coordinating discharge: Over 30 minutes  SIGNED:  Lorella Nimrod, MD  [facility] 12/31/2018, 5:02 PM Pager [phone]  If 7PM-7AM, please contact night-coverage [email or web address] Password [email or web address]  This record has been created using Systems analyst. Errors have been sought and corrected,but may not always be located. Such creation errors do not reflect on the standard of care.

## 2018-12-31 NOTE — Plan of Care (Signed)
  Problem: Education: Goal: Ability to describe self-care measures that may prevent or decrease complications (Diabetes Survival Skills Education) will improve Outcome: Progressing Goal: Individualized Educational Video(s) Outcome: Progressing   Problem: Coping: Goal: Ability to adjust to condition or change in health will improve Outcome: Progressing   Problem: Fluid Volume: Goal: Ability to maintain a balanced intake and output will improve Outcome: Progressing   Problem: Nutritional: Goal: Maintenance of adequate nutrition will improve Outcome: Progressing Goal: Progress toward achieving an optimal weight will improve Outcome: Progressing   Problem: Skin Integrity: Goal: Risk for impaired skin integrity will decrease Outcome: Progressing

## 2018-12-31 NOTE — Progress Notes (Addendum)
Latest BMP called in to MD. Orders from Dr. Reesa Chew to give 10 units of novolog now and recheck blood sugar and she will discharge the patient home. Patient's husband bringing clothes and insulin pump equipment. Patient in agreement with this plan and going to reset up her pump prior to leaving the hospital. She plans to eat once she gets home. Patient education provided regarding signs and symptoms of DKA/hyperglycemia and to come back to the hospital should she again feel ill. Patient understanding and in agreemeent with this. Will prepare the patient for discharge.

## 2018-12-31 NOTE — Plan of Care (Signed)
Patient displays appropriate knowledge for continuing to manage her diabetes at home and understands signs and symptoms that require her to return to the hospital if she is feeling ill.

## 2020-06-06 ENCOUNTER — Emergency Department (HOSPITAL_COMMUNITY): Payer: 59

## 2020-06-06 ENCOUNTER — Other Ambulatory Visit: Payer: Self-pay

## 2020-06-06 ENCOUNTER — Emergency Department (HOSPITAL_COMMUNITY)
Admission: EM | Admit: 2020-06-06 | Discharge: 2020-06-06 | Disposition: A | Discharge status: Home / Self Care | Payer: 59 | Attending: Emergency Medicine | Admitting: Emergency Medicine

## 2020-06-06 ENCOUNTER — Encounter (HOSPITAL_COMMUNITY): Payer: Self-pay

## 2020-06-06 DIAGNOSIS — K209 Esophagitis, unspecified without bleeding: Secondary | ICD-10-CM | POA: Insufficient documentation

## 2020-06-06 DIAGNOSIS — Z794 Long term (current) use of insulin: Secondary | ICD-10-CM | POA: Diagnosis not present

## 2020-06-06 DIAGNOSIS — R000 Tachycardia, unspecified: Secondary | ICD-10-CM | POA: Insufficient documentation

## 2020-06-06 DIAGNOSIS — E1065 Type 1 diabetes mellitus with hyperglycemia: Secondary | ICD-10-CM | POA: Diagnosis not present

## 2020-06-06 DIAGNOSIS — R0789 Other chest pain: Secondary | ICD-10-CM

## 2020-06-06 DIAGNOSIS — R0682 Tachypnea, not elsewhere classified: Secondary | ICD-10-CM | POA: Diagnosis not present

## 2020-06-06 DIAGNOSIS — R079 Chest pain, unspecified: Secondary | ICD-10-CM | POA: Diagnosis present

## 2020-06-06 LAB — COMPREHENSIVE METABOLIC PANEL
ALT: 23 U/L (ref 0–44)
AST: 26 U/L (ref 15–41)
Albumin: 4 g/dL (ref 3.5–5.0)
Alkaline Phosphatase: 58 U/L (ref 38–126)
Anion gap: 13 (ref 5–15)
BUN: 7 mg/dL (ref 6–20)
CO2: 19 mmol/L — ABNORMAL LOW (ref 22–32)
Calcium: 9.7 mg/dL (ref 8.9–10.3)
Chloride: 104 mmol/L (ref 98–111)
Creatinine, Ser: 1.03 mg/dL — ABNORMAL HIGH (ref 0.44–1.00)
GFR, Estimated: >60 mL/min (ref >60)
Glucose, Bld: 242 mg/dL — ABNORMAL HIGH (ref 70–99)
Potassium: 3.4 mmol/L — ABNORMAL LOW (ref 3.5–5.1)
Sodium: 136 mmol/L (ref 135–145)
Total Bilirubin: 0.7 mg/dL (ref 0.3–1.2)
Total Protein: 7.7 g/dL (ref 6.5–8.1)

## 2020-06-06 LAB — I-STAT VENOUS BLOOD GAS, ED
Acid-base deficit: 1 mmol/L (ref 0.0–2.0)
Bicarbonate: 19.5 mmol/L — ABNORMAL LOW (ref 20.0–28.0)
Calcium, Ion: 1.12 mmol/L — ABNORMAL LOW (ref 1.15–1.40)
HCT: 43 % (ref 36.0–46.0)
Hemoglobin: 14.6 g/dL (ref 12.0–15.0)
O2 Saturation: 99 %
Potassium: 3.5 mmol/L (ref 3.5–5.1)
Sodium: 139 mmol/L (ref 135–145)
TCO2: 20 mmol/L — ABNORMAL LOW (ref 22–32)
pCO2, Ven: 23.5 mmHg — ABNORMAL LOW (ref 44.0–60.0)
pH, Ven: 7.526 — ABNORMAL HIGH (ref 7.250–7.430)
pO2, Ven: 108 mmHg — ABNORMAL HIGH (ref 32.0–45.0)

## 2020-06-06 LAB — I-STAT BETA HCG BLOOD, ED (MC, WL, AP ONLY): I-stat hCG, quantitative: <5 m[IU]/mL (ref <5)

## 2020-06-06 LAB — URINALYSIS, ROUTINE W REFLEX MICROSCOPIC
Bilirubin Urine: NEGATIVE
Glucose, UA: 100 mg/dL — AB
Ketones, ur: >80 mg/dL — AB
Leukocytes,Ua: NEGATIVE
Nitrite: NEGATIVE
Protein, ur: NEGATIVE mg/dL
Specific Gravity, Urine: 1.015 (ref 1.005–1.030)
pH: 8 (ref 5.0–8.0)

## 2020-06-06 LAB — CBC WITH DIFFERENTIAL/PLATELET
Abs Immature Granulocytes: 0.03 10*3/uL (ref 0.00–0.07)
Basophils Absolute: 0.1 10*3/uL (ref 0.0–0.1)
Basophils Relative: 1 %
Eosinophils Absolute: 0.2 10*3/uL (ref 0.0–0.5)
Eosinophils Relative: 2 %
HCT: 42.4 % (ref 36.0–46.0)
Hemoglobin: 14.3 g/dL (ref 12.0–15.0)
Immature Granulocytes: 0 %
Lymphocytes Relative: 27 %
Lymphs Abs: 2.2 10*3/uL (ref 0.7–4.0)
MCH: 29.9 pg (ref 26.0–34.0)
MCHC: 33.7 g/dL (ref 30.0–36.0)
MCV: 88.5 fL (ref 80.0–100.0)
Monocytes Absolute: 0.7 10*3/uL (ref 0.1–1.0)
Monocytes Relative: 8 %
Neutro Abs: 5.1 10*3/uL (ref 1.7–7.7)
Neutrophils Relative %: 62 %
Platelets: 306 10*3/uL (ref 150–400)
RBC: 4.79 MIL/uL (ref 3.87–5.11)
RDW: 13 % (ref 11.5–15.5)
WBC: 8.2 10*3/uL (ref 4.0–10.5)
nRBC: 0 % (ref 0.0–0.2)

## 2020-06-06 LAB — I-STAT CHEM 8, ED
BUN: 6 mg/dL (ref 6–20)
Calcium, Ion: 1.14 mmol/L — ABNORMAL LOW (ref 1.15–1.40)
Chloride: 105 mmol/L (ref 98–111)
Creatinine, Ser: 0.8 mg/dL (ref 0.44–1.00)
Glucose, Bld: 241 mg/dL — ABNORMAL HIGH (ref 70–99)
HCT: 44 % (ref 36.0–46.0)
Hemoglobin: 15 g/dL (ref 12.0–15.0)
Potassium: 3.5 mmol/L (ref 3.5–5.1)
Sodium: 139 mmol/L (ref 135–145)
TCO2: 18 mmol/L — ABNORMAL LOW (ref 22–32)

## 2020-06-06 LAB — URINALYSIS, MICROSCOPIC (REFLEX)

## 2020-06-06 LAB — LIPASE, BLOOD: Lipase: 28 U/L (ref 11–51)

## 2020-06-06 LAB — D-DIMER, QUANTITATIVE: D-Dimer, Quant: 0.4 ug/mL-FEU (ref 0.00–0.50)

## 2020-06-06 LAB — TROPONIN I (HIGH SENSITIVITY)
Troponin I (High Sensitivity): <2 ng/L (ref <18)
Troponin I (High Sensitivity): 4 ng/L (ref <18)

## 2020-06-06 LAB — LACTIC ACID, PLASMA
Lactic Acid, Venous: 2.7 mmol/L (ref 0.5–1.9)
Lactic Acid, Venous: 1.2 mmol/L (ref 0.5–1.9)

## 2020-06-06 LAB — CBG MONITORING, ED: Glucose-Capillary: 226 mg/dL — ABNORMAL HIGH (ref 70–99)

## 2020-06-06 MED ORDER — MORPHINE SULFATE (PF) 4 MG/ML IV SOLN
4.0000 mg | Freq: Once | INTRAVENOUS | Status: AC
Start: 2020-06-06 — End: 2020-06-06
  Administered 2020-06-06: 4 mg via INTRAVENOUS
  Filled 2020-06-06: qty 1

## 2020-06-06 MED ORDER — SODIUM CHLORIDE 0.9 % IV BOLUS
1000.0000 mL | Freq: Once | INTRAVENOUS | Status: AC
  Administered 2020-06-06: 1000 mL via INTRAVENOUS

## 2020-06-06 MED ORDER — IOHEXOL 350 MG/ML SOLN
80.0000 mL | Freq: Once | INTRAVENOUS | Status: AC | PRN
  Administered 2020-06-06: 80 mL via INTRAVENOUS

## 2020-06-06 MED ORDER — ONDANSETRON HCL 4 MG/2ML IJ SOLN
4.0000 mg | Freq: Once | INTRAMUSCULAR | Status: AC
  Administered 2020-06-06: 4 mg via INTRAVENOUS
  Filled 2020-06-06: qty 2

## 2020-06-06 NOTE — ED Triage Notes (Signed)
Per PT: Mild chest pain starting Sunday night. N/V starting yesterday. Severe pain this morning, left chest radiating back to shoulder blade. HR in 130s at home. Labored breathing

## 2020-06-06 NOTE — ED Notes (Signed)
Patient transported to CT 

## 2020-06-06 NOTE — Discharge Instructions (Addendum)
Start taking Nexium, prilosec, or Prevacid 1 pill daily in the morning for 7 days.

## 2020-06-06 NOTE — ED Provider Notes (Signed)
[facility] EMERGENCY DEPARTMENT Provider Note   CSN: [id number] Arrival date & time: 06/06/20  7619     History Chief Complaint  Patient presents with  . Chest Pain    Kendra Tate is a 30 y.o. female.  30yo F w/ PMH including IDDM who p/w chest pain and N/V.  Patient reports that 3 days ago, she began having left-sided chest pain that was initially mild and aching/dull.  The pain waxed and waned but has now become severe and constant today.  She reports that the pain has been more sharp today and radiates from her left chest to her back and shoulder blade.  She has had associated nausea and had persistent nausea and vomiting yesterday, unable to eat anything.  She reports ongoing nausea today.  She has been compliant with her insulin at home.  She reports that her blood sugars have been normal.  She reports associated shortness of breath, no fever, cough/cold symptoms, diarrhea, or abdominal pain.  The history is provided by the patient.  Chest Pain      Past Medical History:  Diagnosis Date  . Diabetes mellitus without complication [facility])     Patient Active Problem List   Diagnosis Date Noted  . Diabetic ketoacidosis without coma associated with type 1 diabetes mellitus (HCC) 12/30/2018  . Nausea with vomiting 12/30/2018  . Leukocytosis 12/30/2018  . Acute renal insufficiency 12/30/2018    Past Surgical History:  Procedure Laterality Date  . CESAREAN SECTION       OB History   No obstetric history on file.     History reviewed. No pertinent family history.  Social History   Tobacco Use  . Smoking status: Never Smoker  . Smokeless tobacco: Never Used  Substance Use Topics  . Drug use: Never    Home Medications Prior to Admission medications   Medication Sig Start Date End Date Taking? Authorizing Provider  escitalopram (LEXAPRO) 10 MG tablet Take 10 mg by mouth daily. 07/27/18  Yes [provider]  glucagon 1 MG injection  Inject 1 mg into the skin as needed. 09/04/15  Yes [provider]  ibuprofen (ADVIL) 200 MG tablet Take 400 mg by mouth every 6 (six) hours as needed for mild pain.   Yes [provider]  insulin lispro (HUMALOG) 100 UNIT/ML injection Inject 70 Units into the skin daily. Insulin pump 09/20/15  Yes [provider]  Multiple Vitamin (MULTIVITAMIN) tablet Take 1 tablet by mouth daily.   Yes [provider]  norethindrone-ethinyl estradiol-iron (ESTROSTEP FE) 1-20/1-30/1-35 MG-MCG tablet Take 1 tablet by mouth daily.   Yes [provider]    Allergies    Patient has no allergy information on record.  Review of Systems   Review of Systems  Cardiovascular: Positive for chest pain.   All other systems reviewed and are negative except that which was mentioned in HPI  Physical Exam Updated Vital Signs BP 111/64   Pulse 73   Temp 97.6 F (36.4 C) (Oral)   Resp (!) 41   LMP 05/23/2020   SpO2 100%   Physical Exam Vitals and nursing note reviewed.  Constitutional:      General: She is in acute distress.     Appearance: Normal appearance.  HENT:     Head: Normocephalic and atraumatic.  Eyes:     Conjunctiva/sclera: Conjunctivae normal.  Cardiovascular:     Rate and Rhythm: Regular rhythm. Tachycardia present.     Heart sounds: Normal  heart sounds. No murmur heard.   Pulmonary:     Effort: Pulmonary effort is normal. Tachypnea present.     Breath sounds: Normal breath sounds.     Comments: hyperventilating Abdominal:     General: Abdomen is flat. There is no distension.     Palpations: Abdomen is soft.     Tenderness: There is abdominal tenderness. There is no guarding or rebound.     Comments: Hyperactive BS, midepigastric tenderness  Musculoskeletal:     Right lower leg: No edema.     Left lower leg: No edema.  Skin:    General: Skin is warm and dry.  Neurological:     Mental Status: She is alert and oriented to person, place, and  time.     Comments: fluent  Psychiatric:        Mood and Affect: Mood is anxious.     Comments: Distressed, moaning     ED Results / Procedures / Treatments   Labs (all labs ordered are listed, but only abnormal results are displayed) Labs Reviewed  COMPREHENSIVE METABOLIC PANEL - Abnormal; Notable for the following components:      Result Value   Potassium 3.4 (*)    CO2 19 (*)    Glucose, Bld 242 (*)    Creatinine, Ser 1.03 (*)    All other components within normal limits  LACTIC ACID, PLASMA - Abnormal; Notable for the following components:   Lactic Acid, Venous 2.7 (*)    All other components within normal limits  URINALYSIS, ROUTINE W REFLEX MICROSCOPIC - Abnormal; Notable for the following components:   APPearance TURBID (*)    Glucose, UA 100 (*)    Hgb urine dipstick TRACE (*)    Ketones, ur >80 (*)    All other components within normal limits  URINALYSIS, MICROSCOPIC (REFLEX) - Abnormal; Notable for the following components:   Bacteria, UA MANY (*)    All other components within normal limits  CBG MONITORING, ED - Abnormal; Notable for the following components:   Glucose-Capillary 226 (*)    All other components within normal limits  I-STAT CHEM 8, ED - Abnormal; Notable for the following components:   Glucose, Bld 241 (*)    Calcium, Ion 1.14 (*)    TCO2 18 (*)    All other components within normal limits  I-STAT VENOUS BLOOD GAS, ED - Abnormal; Notable for the following components:   pH, Ven 7.526 (*)    pCO2, Ven 23.5 (*)    pO2, Ven 108.0 (*)    Bicarbonate 19.5 (*)    TCO2 20 (*)    Calcium, Ion 1.12 (*)    All other components within normal limits  LIPASE, BLOOD  LACTIC ACID, PLASMA  CBC WITH DIFFERENTIAL/PLATELET  D-DIMER, QUANTITATIVE  I-STAT BETA HCG BLOOD, ED ([facility], [facility], [facility] ONLY)  TROPONIN I (HIGH SENSITIVITY)  TROPONIN I (HIGH SENSITIVITY)    EKG EKG Interpretation  Date/Time:  Wednesday June 06 2020 07:36:01 EDT Ventricular Rate:  132 PR  Interval:  125 QRS Duration: 79 QT Interval:  307 QTC Calculation: 455 R Axis:   97 Text Interpretation: Sinus tachycardia Borderline right axis deviation similar to previous Confirmed by Frederick Peers [phone]) on 06/06/2020 8:12:03 AM   Radiology DG Chest Port 1 View  Result Date: 06/06/2020 CLINICAL DATA:  Severe chest pain.  Pain radiates to back. EXAM: PORTABLE CHEST 1 VIEW COMPARISON:  12/30/2018. FINDINGS: Mediastinum and hilar structures normal. Heart size normal. Low lung volumes. No  focal alveolar infiltrate. No pleural effusion or pneumothorax. No acute bony abnormality. IMPRESSION: Low lung volumes.  No acute cardiopulmonary disease. Electronically Signed   By: Maisie Fus  Register   On: 06/06/2020 08:20   CT Angio Chest Aorta w/CM &/OR wo/CM  Result Date: 06/06/2020 CLINICAL DATA:  Chest or back pain.  Aortic dissection suspected. EXAM: CT ANGIOGRAPHY CHEST WITH CONTRAST TECHNIQUE: Multidetector CT imaging of the chest was performed using the standard protocol during bolus administration of intravenous contrast. Multiplanar CT image reconstructions and MIPs were obtained to evaluate the vascular anatomy. CONTRAST:  24mL OMNIPAQUE IOHEXOL 350 MG/ML SOLN COMPARISON:  Radiography from earlier today FINDINGS: Cardiovascular: No aortic intramural hemotoma. Preferential opacification of the thoracic aorta. No evidence of thoracic aortic aneurysm or dissection. Normal heart size. No pericardial effusion. Mediastinum/Nodes: Negative Lungs/Pleura: Lungs are clear. No pleural effusion or pneumothorax. Upper Abdomen: No acute abnormality. Musculoskeletal: Negative Review of the MIP images confirms the above findings. IMPRESSION: Negative chest CTA. Electronically Signed   By: Marnee Spring M.D.   On: 06/06/2020 10:52    Procedures Procedures   Medications Ordered in ED Medications  ondansetron (ZOFRAN) injection 4 mg (4 mg Intravenous Given 06/06/20 0801)  morphine 4 MG/ML injection 4 mg (4 mg  Intravenous Given 06/06/20 0802)  sodium chloride 0.9 % bolus 1,000 mL (0 mLs Intravenous Stopped 06/06/20 0912)  iohexol (OMNIPAQUE) 350 MG/ML injection 80 mL (80 mLs Intravenous Contrast Given 06/06/20 1032)    ED Course  I have reviewed the triage vital signs and the nursing notes.  Pertinent labs & imaging results that were available during my care of the patient were reviewed by me and considered in my medical decision making (see chart for details).    MDM Rules/Calculators/A&P                          In distress due to pain on exam, VS notable for tachycardia and tachypnea, hyperventilating but normal O2 sats and clear breath sounds. EKG without ischemic changes. DDx for chest pain includes PE, aortic dissection, Boerhaave's (less likely as chest pain started before vomiting,) less likely ACS. I am also concerned about the possibility of DKA.  Lab work overall reassuring with mild hyperglycemia but normal anion gap and no evidence of DKA, VBG shows hyperventilation.  Lactate initially 2.7 but clear to 1.2 after fluids.  She has been afebrile with no infectious symptoms therefore doubt sepsis.  Serial troponins are negative and she has no concerning features on her EKG.  D-dimer negative making PE very unlikely.  As of the severity of her pain and abnormal vital signs, obtained CTA of chest to evaluate for dissection.  CTA negative.  Reassessments, patient was calm and appeared much more comfortable, all vital signs normalized.  She later noted that she just finished a course of doxycycline which has caused esophageal burning pain and it is possible that her symptoms were due to esophagitis or spasm.  She also took a lot of ibuprofen yesterday.  Have counseled on supportive measures for this and encouraged to take PPI daily for the next week.  Instructed to follow-up with her PCP regarding these symptoms.  Reviewed return precautions and she voiced understanding. Final Clinical Impression(s) /  ED Diagnoses Final diagnoses:  Atypical chest pain  Esophagitis    Rx / DC Orders ED Discharge Orders    None       Candance Bohlman, Ambrose [place], MD 06/06/20 1219

## 2020-06-07 ENCOUNTER — Emergency Department (HOSPITAL_COMMUNITY): Payer: 59

## 2020-06-07 ENCOUNTER — Emergency Department (HOSPITAL_COMMUNITY)
Admission: EM | Admit: 2020-06-07 | Discharge: 2020-06-08 | Disposition: A | Discharge status: Home / Self Care | Payer: 59 | Attending: Emergency Medicine | Admitting: Emergency Medicine

## 2020-06-07 DIAGNOSIS — R002 Palpitations: Secondary | ICD-10-CM | POA: Insufficient documentation

## 2020-06-07 DIAGNOSIS — R000 Tachycardia, unspecified: Secondary | ICD-10-CM | POA: Insufficient documentation

## 2020-06-07 DIAGNOSIS — R079 Chest pain, unspecified: Secondary | ICD-10-CM | POA: Diagnosis present

## 2020-06-07 DIAGNOSIS — Z794 Long term (current) use of insulin: Secondary | ICD-10-CM | POA: Diagnosis not present

## 2020-06-07 DIAGNOSIS — E1010 Type 1 diabetes mellitus with ketoacidosis without coma: Secondary | ICD-10-CM | POA: Diagnosis not present

## 2020-06-07 DIAGNOSIS — R0602 Shortness of breath: Secondary | ICD-10-CM | POA: Insufficient documentation

## 2020-06-07 LAB — CBC WITH DIFFERENTIAL/PLATELET
Abs Immature Granulocytes: 0.04 10*3/uL (ref 0.00–0.07)
Basophils Absolute: 0.1 10*3/uL (ref 0.0–0.1)
Basophils Relative: 1 %
Eosinophils Absolute: 0.1 10*3/uL (ref 0.0–0.5)
Eosinophils Relative: 1 %
HCT: 42.1 % (ref 36.0–46.0)
Hemoglobin: 13.8 g/dL (ref 12.0–15.0)
Immature Granulocytes: 0 %
Lymphocytes Relative: 28 %
Lymphs Abs: 2.9 10*3/uL (ref 0.7–4.0)
MCH: 29.7 pg (ref 26.0–34.0)
MCHC: 32.8 g/dL (ref 30.0–36.0)
MCV: 90.7 fL (ref 80.0–100.0)
Monocytes Absolute: 1.1 10*3/uL — ABNORMAL HIGH (ref 0.1–1.0)
Monocytes Relative: 10 %
Neutro Abs: 6.2 10*3/uL (ref 1.7–7.7)
Neutrophils Relative %: 60 %
Platelets: 303 10*3/uL (ref 150–400)
RBC: 4.64 MIL/uL (ref 3.87–5.11)
RDW: 13.1 % (ref 11.5–15.5)
WBC: 10.4 10*3/uL (ref 4.0–10.5)
nRBC: 0 % (ref 0.0–0.2)

## 2020-06-07 LAB — TROPONIN I (HIGH SENSITIVITY)
Troponin I (High Sensitivity): 3 ng/L (ref <18)
Troponin I (High Sensitivity): 2 ng/L (ref <18)

## 2020-06-07 LAB — D-DIMER, QUANTITATIVE: D-Dimer, Quant: 0.36 ug/mL-FEU (ref 0.00–0.50)

## 2020-06-07 LAB — COMPREHENSIVE METABOLIC PANEL
ALT: 21 U/L (ref 0–44)
AST: 29 U/L (ref 15–41)
Albumin: 4 g/dL (ref 3.5–5.0)
Alkaline Phosphatase: 53 U/L (ref 38–126)
Anion gap: 16 — ABNORMAL HIGH (ref 5–15)
BUN: 6 mg/dL (ref 6–20)
CO2: 16 mmol/L — ABNORMAL LOW (ref 22–32)
Calcium: 9.6 mg/dL (ref 8.9–10.3)
Chloride: 105 mmol/L (ref 98–111)
Creatinine, Ser: 1.15 mg/dL — ABNORMAL HIGH (ref 0.44–1.00)
GFR, Estimated: >60 mL/min (ref >60)
Glucose, Bld: 130 mg/dL — ABNORMAL HIGH (ref 70–99)
Potassium: 3.1 mmol/L — ABNORMAL LOW (ref 3.5–5.1)
Sodium: 137 mmol/L (ref 135–145)
Total Bilirubin: 0.9 mg/dL (ref 0.3–1.2)
Total Protein: 7.5 g/dL (ref 6.5–8.1)

## 2020-06-07 LAB — TSH: TSH: 1.665 u[IU]/mL (ref 0.350–4.500)

## 2020-06-07 MED ORDER — POTASSIUM CHLORIDE CRYS ER 20 MEQ PO TBCR
40.0000 meq | EXTENDED_RELEASE_TABLET | Freq: Once | ORAL | Status: AC
  Administered 2020-06-07: 40 meq via ORAL
  Filled 2020-06-07: qty 2

## 2020-06-07 MED ORDER — METOPROLOL TARTRATE 5 MG/5ML IV SOLN: 5.0000 mg | Freq: Once | INTRAVENOUS | Status: DC

## 2020-06-07 MED ORDER — SODIUM CHLORIDE 0.9 % IV BOLUS
1000.0000 mL | Freq: Once | INTRAVENOUS | Status: AC
  Administered 2020-06-07: 1000 mL via INTRAVENOUS

## 2020-06-07 MED ORDER — METOPROLOL TARTRATE 25 MG PO TABS: 25.0000 mg | ORAL_TABLET | Freq: Every day | ORAL | 0 refills | Status: AC | PRN

## 2020-06-07 MED ORDER — METOPROLOL TARTRATE 25 MG PO TABS
12.5000 mg | ORAL_TABLET | Freq: Once | ORAL | Status: AC
  Administered 2020-06-07: 12.5 mg via ORAL
  Filled 2020-06-07: qty 1

## 2020-06-07 NOTE — ED Provider Notes (Signed)
[facility] EMERGENCY DEPARTMENT Provider Note   CSN: [id number] Arrival date & time: 06/07/20  1839     History Chief Complaint  Patient presents with  . Chest Pain    Kendra Tate is a 30 y.o. female history of diabetes, here presenting with shortness of breath.  Patient is a paramedic.  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  She was noted to be tachycardic at that time and her D-dimer and her CTA was negative.  Patient was given IV fluids and her heart rate went down to the 80s.  Patient states that she was working today and she became progressively short of breath after she removed the patient.  Patient has a apple watch and noticed that her heart rate went up to the 130s.  Patient denies any fevers or chills or cough or COVID symptoms.  Patient was told to come to the ER.  She has no known heart disease  The history is provided by the patient.       Past Medical History:  Diagnosis Date  . Diabetes mellitus without complication [facility])     Patient Active Problem List   Diagnosis Date Noted  . Diabetic ketoacidosis without coma associated with type 1 diabetes mellitus (HCC) 12/30/2018  . Nausea with vomiting 12/30/2018  . Leukocytosis 12/30/2018  . Acute renal insufficiency 12/30/2018    Past Surgical History:  Procedure Laterality Date  . CESAREAN SECTION       OB History   No obstetric history on file.     No family history on file.  Social History   Tobacco Use  . Smoking status: Never Smoker  . Smokeless tobacco: Never Used  Substance Use Topics  . Drug use: Never    Home Medications Prior to Admission medications   Medication Sig Start Date End Date Taking? Authorizing Provider  escitalopram (LEXAPRO) 10 MG tablet Take 10 mg by mouth daily. 07/27/18   [provider]  glucagon 1 MG injection Inject 1 mg into the skin as needed. 09/04/15   [provider]  ibuprofen (ADVIL) 200 MG tablet Take 400 mg by  mouth every 6 (six) hours as needed for mild pain.    [provider]  insulin lispro (HUMALOG) 100 UNIT/ML injection Inject 70 Units into the skin daily. Insulin pump 09/20/15   [provider]  Multiple Vitamin (MULTIVITAMIN) tablet Take 1 tablet by mouth daily.    [provider]  norethindrone-ethinyl estradiol-iron (ESTROSTEP FE) 1-20/1-30/1-35 MG-MCG tablet Take 1 tablet by mouth daily.    [provider]    Allergies    Patient has no allergy information on record.  Review of Systems   Review of Systems  Cardiovascular: Positive for chest pain and palpitations.  All other systems reviewed and are negative.   Physical Exam Updated Vital Signs BP 117/75   Pulse (!) 109   Temp 97.7 F (36.5 C) (Oral)   Resp 18   LMP 05/23/2020   SpO2 100%   Physical Exam Vitals and nursing note reviewed.  Constitutional:      Comments: Slightly tachypneic  HENT:     Head: Normocephalic.  Eyes:     Extraocular Movements: Extraocular movements intact.     Pupils: Pupils are equal, round, and reactive to light.  Cardiovascular:     Rate and Rhythm: Regular rhythm. Tachycardia present.     Heart sounds: Normal heart sounds.  Pulmonary:     Effort: Pulmonary effort  is normal.     Breath sounds: Normal breath sounds.  Abdominal:     General: Bowel sounds are normal.     Palpations: Abdomen is soft.  Musculoskeletal:        General: Normal range of motion.     Cervical back: Normal range of motion and neck supple.  Skin:    General: Skin is warm.  Neurological:     General: No focal deficit present.     Mental Status: She is oriented to person, place, and time.  Psychiatric:        Mood and Affect: Mood normal.        Behavior: Behavior normal.     ED Results / Procedures / Treatments   Labs (all labs ordered are listed, but only abnormal results are displayed) Labs Reviewed  CBC WITH DIFFERENTIAL/PLATELET - Abnormal; Notable for the  following components:      Result Value   Monocytes Absolute 1.1 (*)    All other components within normal limits  COMPREHENSIVE METABOLIC PANEL - Abnormal; Notable for the following components:   Potassium 3.1 (*)    CO2 16 (*)    Glucose, Bld 130 (*)    Creatinine, Ser 1.15 (*)    Anion gap 16 (*)    All other components within normal limits  D-DIMER, QUANTITATIVE  TSH  I-STAT BETA HCG BLOOD, ED ([facility], [facility], [facility] ONLY)  TROPONIN I (HIGH SENSITIVITY)  TROPONIN I (HIGH SENSITIVITY)    EKG EKG Interpretation  Date/Time:  Thursday June 07 2020 18:42:02 EDT Ventricular Rate:  128 PR Interval:  129 QRS Duration: 83 QT Interval:  332 QTC Calculation: 485 R Axis:   85 Text Interpretation: Sinus tachycardia Borderline prolonged QT interval Since last tracing rate faster Confirmed by Richardean Canal [id number]) on 06/07/2020 7:03:05 PM   Radiology DG Chest Port 1 View  Result Date: 06/07/2020 CLINICAL DATA:  Chest pain for few days EXAM: PORTABLE CHEST 1 VIEW COMPARISON:  06/06/2020 FINDINGS: Cardiac shadows within normal limits. The lungs are clear. No bony abnormality is seen. IMPRESSION: No active disease. Electronically Signed   By: Alcide Clever M.D.   On: 06/07/2020 20:09   DG Chest Port 1 View  Result Date: 06/06/2020 CLINICAL DATA:  Severe chest pain.  Pain radiates to back. EXAM: PORTABLE CHEST 1 VIEW COMPARISON:  12/30/2018. FINDINGS: Mediastinum and hilar structures normal. Heart size normal. Low lung volumes. No focal alveolar infiltrate. No pleural effusion or pneumothorax. No acute bony abnormality. IMPRESSION: Low lung volumes.  No acute cardiopulmonary disease. Electronically Signed   By: Maisie Fus  Register   On: 06/06/2020 08:20   CT Angio Chest Aorta w/CM &/OR wo/CM  Result Date: 06/06/2020 CLINICAL DATA:  Chest or back pain.  Aortic dissection suspected. EXAM: CT ANGIOGRAPHY CHEST WITH CONTRAST TECHNIQUE: Multidetector CT imaging of the chest was performed using the standard  protocol during bolus administration of intravenous contrast. Multiplanar CT image reconstructions and MIPs were obtained to evaluate the vascular anatomy. CONTRAST:  34mL OMNIPAQUE IOHEXOL 350 MG/ML SOLN COMPARISON:  Radiography from earlier today FINDINGS: Cardiovascular: No aortic intramural hemotoma. Preferential opacification of the thoracic aorta. No evidence of thoracic aortic aneurysm or dissection. Normal heart size. No pericardial effusion. Mediastinum/Nodes: Negative Lungs/Pleura: Lungs are clear. No pleural effusion or pneumothorax. Upper Abdomen: No acute abnormality. Musculoskeletal: Negative Review of the MIP images confirms the above findings. IMPRESSION: Negative chest CTA. Electronically Signed   By: Marnee Spring M.D.   On: 06/06/2020 10:52  Procedures Procedures   Medications Ordered in ED Medications  sodium chloride 0.9 % bolus 1,000 mL (1,000 mLs Intravenous New Bag/Given 06/07/20 2219)  metoprolol tartrate (LOPRESSOR) tablet 12.5 mg (12.5 mg Oral Given 06/07/20 2213)  potassium chloride SA (KLOR-CON) CR tablet 40 mEq (40 mEq Oral Given 06/07/20 2212)    ED Course  I have reviewed the triage vital signs and the nursing notes.  Pertinent labs & imaging results that were available during my care of the patient were reviewed by me and considered in my medical decision making (see chart for details).    MDM Rules/Calculators/A&P                         Kendra Tate is a 30 y.o. female here presenting with palpitations and chest pain.  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  Patient came back for persistent palpitations again.  Tachycardic 120s on arrival.  Plan to repeat CBC and CMP and troponin.  Will hydrate and give beta-blocker and reassess.   11:15 PM Labs and troponin negative.  Patient given IV fluids and metoprolol and heart rate is down to the 70s.  Will give metoprolol as needed.  Will refer to cardiology outpatient  Final  Clinical Impression(s) / ED Diagnoses Final diagnoses:  None    Rx / DC Orders ED Discharge Orders    None       Charlynne Pander, MD 06/07/20 2316

## 2020-06-07 NOTE — Discharge Instructions (Signed)
Take metoprolol as needed for palpitations   See cardiology for follow up   Return to ER if you have uncontrolled palpitations, chest pain, trouble breathing

## 2020-06-07 NOTE — ED Triage Notes (Signed)
Pt arrives to ED BIB [facility] due to CP that has been going on for the past couple of days. Pt states pain starts on Rt side of chest and radiates to her shoulder and back. Pt tachypenic.

## 2020-06-08 MED ORDER — POTASSIUM CHLORIDE CRYS ER 20 MEQ PO TBCR
40.0000 meq | EXTENDED_RELEASE_TABLET | Freq: Once | ORAL | Status: AC
  Administered 2020-06-08: 40 meq via ORAL
  Filled 2020-06-08: qty 2

## 2020-08-17 ENCOUNTER — Ambulatory Visit: Payer: 59 | Admitting: Cardiovascular Disease

## 2020-10-26 ENCOUNTER — Ambulatory Visit: Payer: 59 | Admitting: Cardiovascular Disease

## 2022-07-16 IMAGING — CT CT ANGIO CHEST
2 of 6 series · 19 of 46 positions shown · IV contrast (OMNI)
Comparison: Radiography from earlier today

CLINICAL DATA: Chest or back pain.  Aortic dissection suspected.

EXAM:
CT ANGIOGRAPHY CHEST WITH CONTRAST
TECHNIQUE: Multidetector CT imaging of the chest was performed using the
standard protocol during bolus administration of intravenous
contrast. Multiplanar CT image reconstructions and MIPs were
obtained to evaluate the vascular anatomy.
CONTRAST:  80mL OMNIPAQUE IOHEXOL 350 MG/ML SOLN

[Series 5: dissection 3.0 i30f 3 · axial · 0.69mm/px · z∈[+1234,+1495]mm · 16 of 99 slices shown]
[im 6/99  lung]
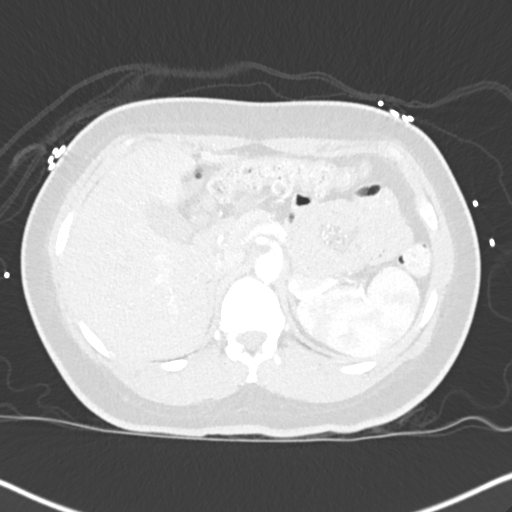
[im 11/99  soft-tissue]
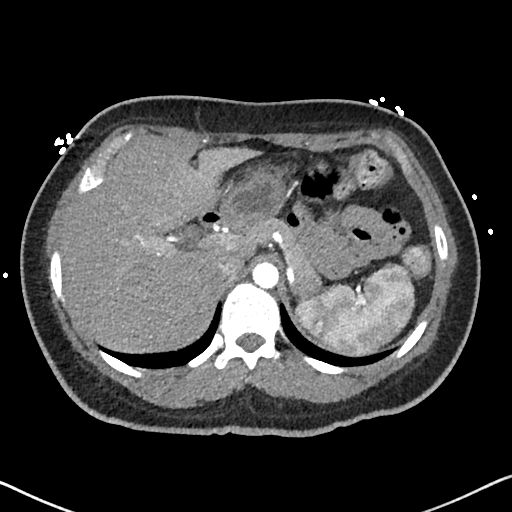
[im 16/99  lung]
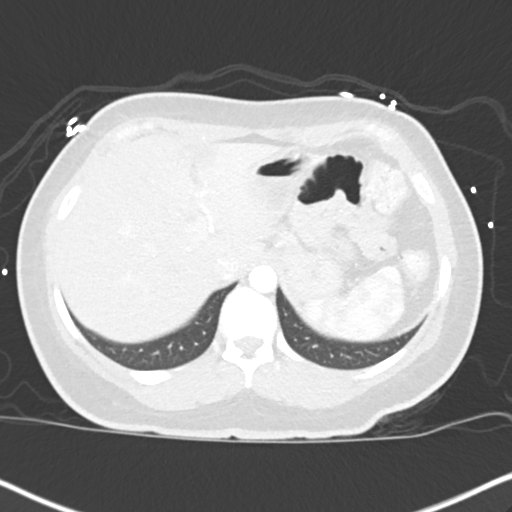
[im 21/99  soft-tissue]
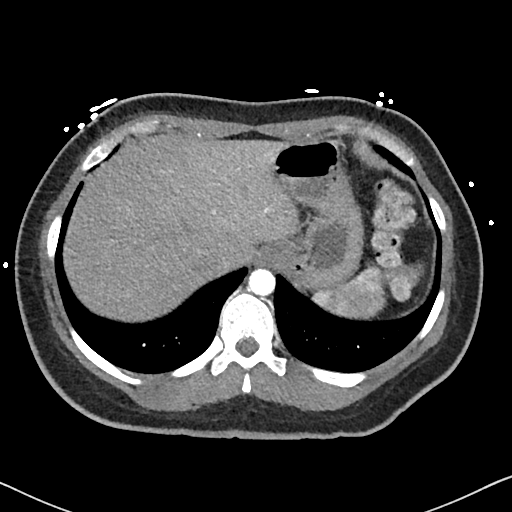
[im 31/99  lung]
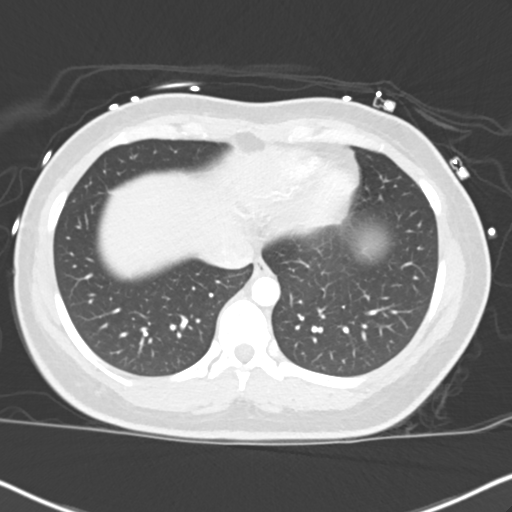
[im 37/99  soft-tissue]
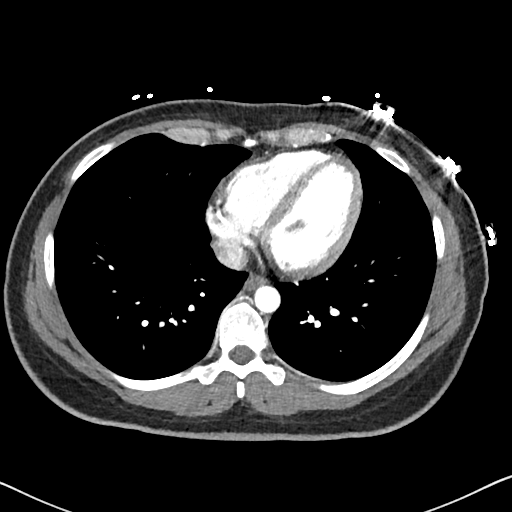
[im 42/99  lung]
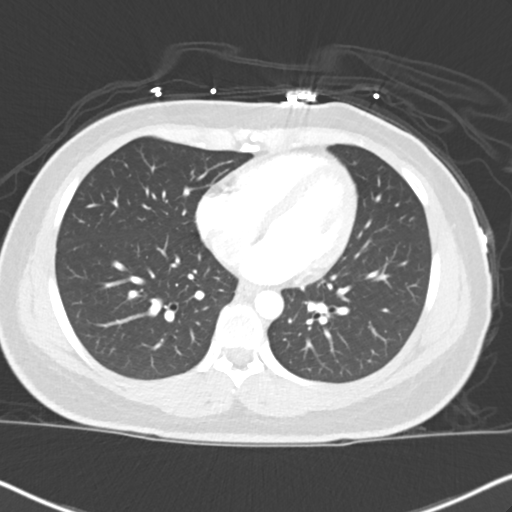
[im 47/99  soft-tissue]
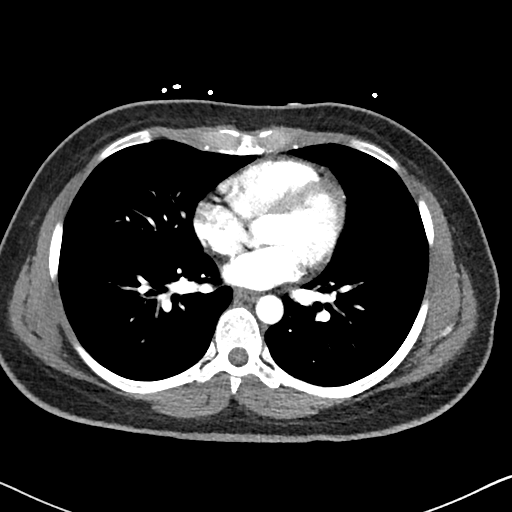
[im 52/99  lung]
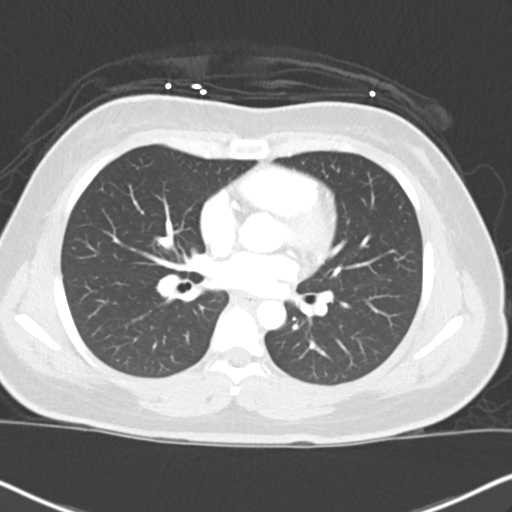
[im 57/99  soft-tissue]
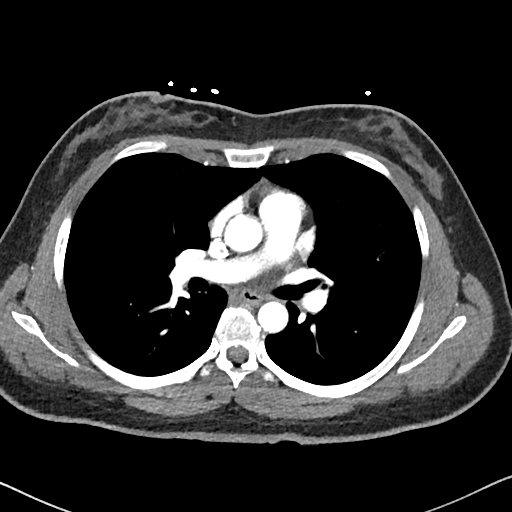
[im 62/99  lung]
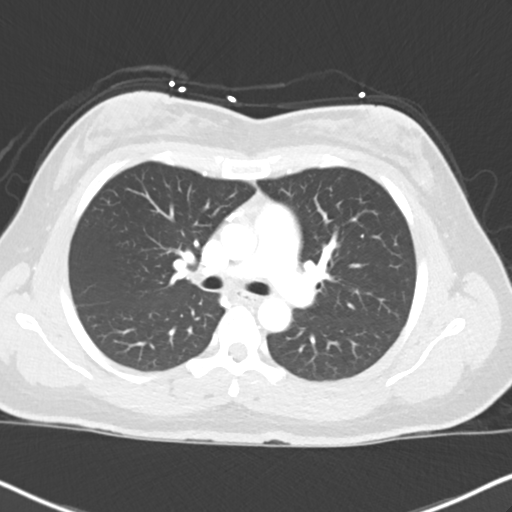
[im 68/99  soft-tissue]
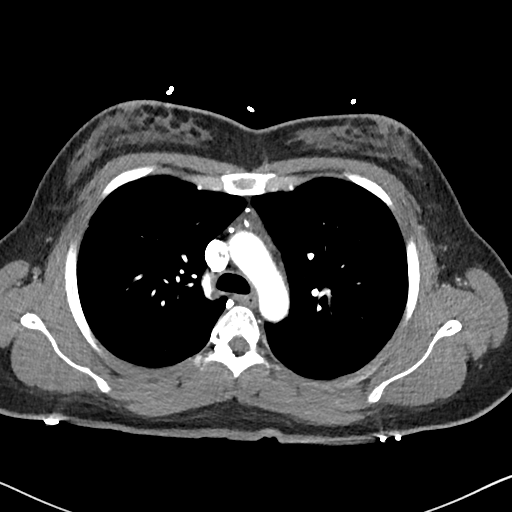
[im 78/99  lung]
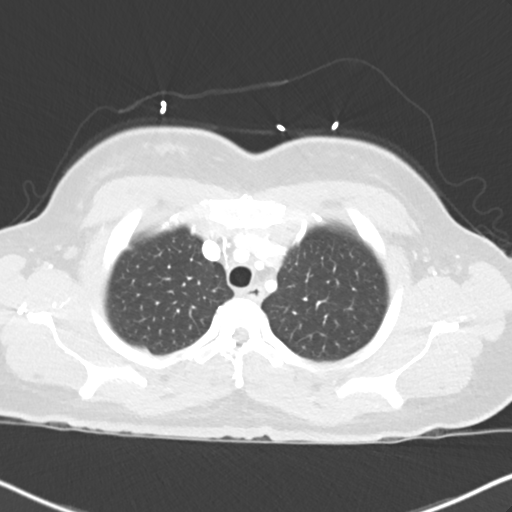
[im 83/99  soft-tissue]
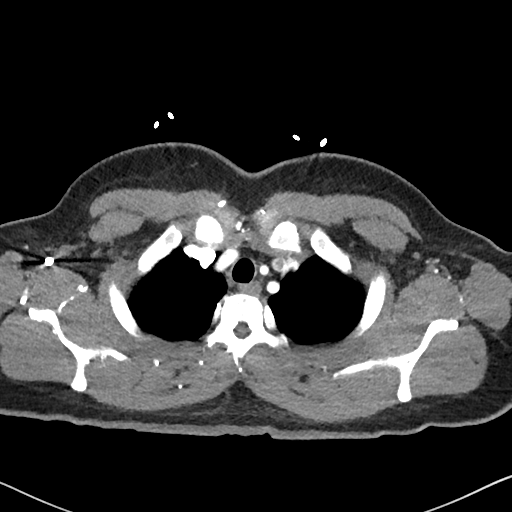
[im 88/99  lung]
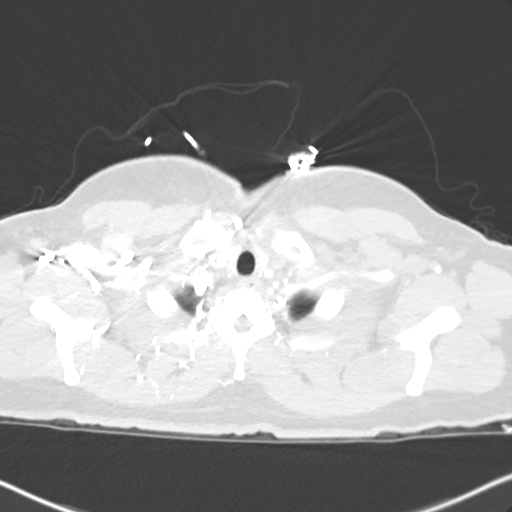
[im 93/99  soft-tissue]
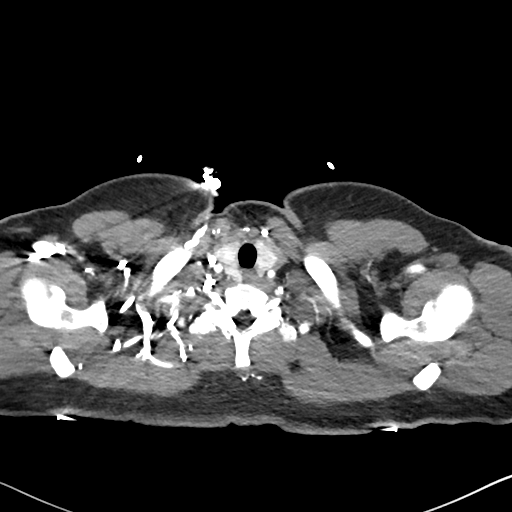

[Series 9: coronals · coronal · 0.60mm/px · 3 of 110 slices shown]
[im 28/110  soft-tissue]
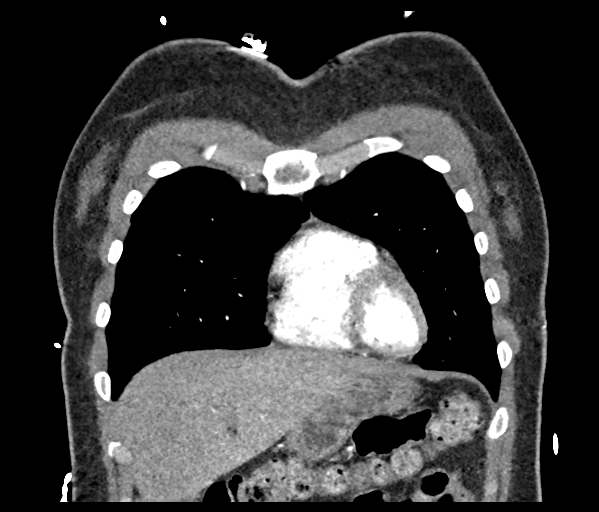
[im 55/110  soft-tissue]
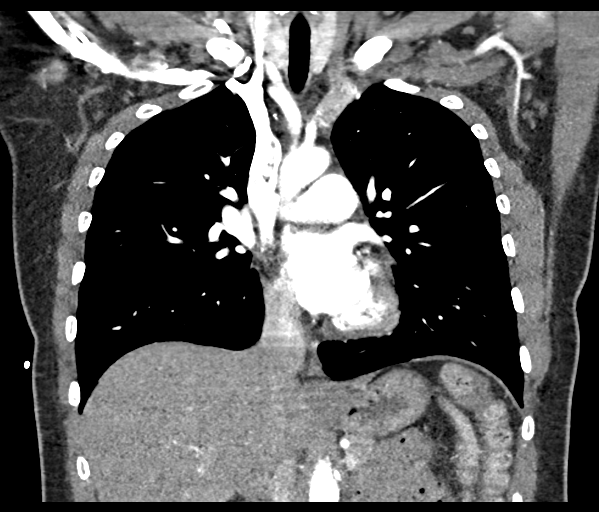
[im 82/110  soft-tissue]
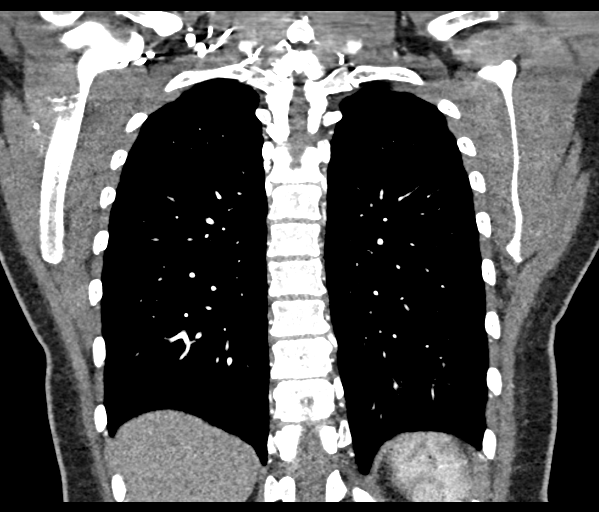

[19 of 46 positions shown; findings below may reference images not displayed]

FINDINGS: Cardiovascular: No aortic intramural hemotoma. Preferential
opacification of the thoracic aorta. No evidence of thoracic aortic
aneurysm or dissection. Normal heart size. No pericardial effusion.

Mediastinum/Nodes: Negative

Lungs/Pleura: Lungs are clear. No pleural effusion or pneumothorax.

Upper Abdomen: No acute abnormality.

Musculoskeletal: Negative

Review of the MIP images confirms the above findings.
IMPRESSION: Negative chest CTA.

## 2023-10-14 ENCOUNTER — Encounter (HOSPITAL_COMMUNITY): Payer: Self-pay

## 2023-10-14 ENCOUNTER — Emergency Department (HOSPITAL_COMMUNITY)
Admission: EM | Admit: 2023-10-14 | Discharge: 2023-10-14 | Disposition: A | Discharge status: Home / Self Care | Payer: PRIVATE HEALTH INSURANCE | Attending: Emergency Medicine | Admitting: Emergency Medicine

## 2023-10-14 ENCOUNTER — Other Ambulatory Visit: Payer: Self-pay

## 2023-10-14 ENCOUNTER — Emergency Department (HOSPITAL_COMMUNITY): Payer: PRIVATE HEALTH INSURANCE

## 2023-10-14 DIAGNOSIS — D72829 Elevated white blood cell count, unspecified: Secondary | ICD-10-CM | POA: Diagnosis not present

## 2023-10-14 DIAGNOSIS — Z794 Long term (current) use of insulin: Secondary | ICD-10-CM | POA: Diagnosis not present

## 2023-10-14 DIAGNOSIS — N23 Unspecified renal colic: Secondary | ICD-10-CM | POA: Insufficient documentation

## 2023-10-14 DIAGNOSIS — E1165 Type 2 diabetes mellitus with hyperglycemia: Secondary | ICD-10-CM | POA: Diagnosis not present

## 2023-10-14 DIAGNOSIS — R739 Hyperglycemia, unspecified: Secondary | ICD-10-CM

## 2023-10-14 DIAGNOSIS — R109 Unspecified abdominal pain: Secondary | ICD-10-CM | POA: Diagnosis present

## 2023-10-14 LAB — URINALYSIS, ROUTINE W REFLEX MICROSCOPIC
Bacteria, UA: NONE SEEN
Bilirubin Urine: NEGATIVE
Glucose, UA: >=500 mg/dL — AB
Ketones, ur: 80 mg/dL — AB
Leukocytes,Ua: NEGATIVE
Nitrite: NEGATIVE
Protein, ur: NEGATIVE mg/dL
RBC / HPF: >50 RBC/hpf (ref 0–5)
Specific Gravity, Urine: 1.022 (ref 1.005–1.030)
pH: 8 (ref 5.0–8.0)

## 2023-10-14 LAB — COMPREHENSIVE METABOLIC PANEL WITH GFR
ALT: 28 U/L (ref 0–44)
AST: 34 U/L (ref 15–41)
Albumin: 4.1 g/dL (ref 3.5–5.0)
Alkaline Phosphatase: 58 U/L (ref 38–126)
Anion gap: 15 (ref 5–15)
BUN: 12 mg/dL (ref 6–20)
CO2: 21 mmol/L — ABNORMAL LOW (ref 22–32)
Calcium: 9.6 mg/dL (ref 8.9–10.3)
Chloride: 101 mmol/L (ref 98–111)
Creatinine, Ser: 0.78 mg/dL (ref 0.44–1.00)
GFR, Estimated: >60 mL/min (ref >60)
Glucose, Bld: 227 mg/dL — ABNORMAL HIGH (ref 70–99)
Potassium: 3.5 mmol/L (ref 3.5–5.1)
Sodium: 137 mmol/L (ref 135–145)
Total Bilirubin: 0.9 mg/dL (ref 0.0–1.2)
Total Protein: 7.9 g/dL (ref 6.5–8.1)

## 2023-10-14 LAB — CBC
HCT: 36.6 % (ref 36.0–46.0)
Hemoglobin: 12.2 g/dL (ref 12.0–15.0)
MCH: 29.4 pg (ref 26.0–34.0)
MCHC: 33.3 g/dL (ref 30.0–36.0)
MCV: 88.2 fL (ref 80.0–100.0)
Platelets: 339 K/uL (ref 150–400)
RBC: 4.15 MIL/uL (ref 3.87–5.11)
RDW: 13 % (ref 11.5–15.5)
WBC: 15.3 K/uL — ABNORMAL HIGH (ref 4.0–10.5)
nRBC: 0 % (ref 0.0–0.2)

## 2023-10-14 LAB — LIPASE, BLOOD: Lipase: 39 U/L (ref 11–51)

## 2023-10-14 LAB — POC URINE PREG, ED: Preg Test, Ur: NEGATIVE

## 2023-10-14 LAB — CBG MONITORING, ED: Glucose-Capillary: 234 mg/dL — ABNORMAL HIGH (ref 70–99)

## 2023-10-14 MED ORDER — KETOROLAC TROMETHAMINE 30 MG/ML IJ SOLN
30.0000 mg | Freq: Once | INTRAMUSCULAR | Status: AC
  Administered 2023-10-14: 30 mg via INTRAVENOUS
  Filled 2023-10-14: qty 1

## 2023-10-14 MED ORDER — HYDROMORPHONE HCL 1 MG/ML IJ SOLN
0.5000 mg | Freq: Once | INTRAMUSCULAR | Status: AC
  Administered 2023-10-14: 0.5 mg via INTRAVENOUS
  Filled 2023-10-14: qty 0.5

## 2023-10-14 MED ORDER — ONDANSETRON HCL 4 MG/2ML IJ SOLN
4.0000 mg | Freq: Once | INTRAMUSCULAR | Status: AC
  Administered 2023-10-14: 4 mg via INTRAVENOUS
  Filled 2023-10-14: qty 2

## 2023-10-14 NOTE — ED Provider Triage Note (Signed)
 Emergency Medicine Provider Triage Evaluation Note  Rukiya Hodgkins , a 33 y.o. female  was evaluated in triage.  Pt complains of sudden onset left flank pain around 3 PM today.  Pain has been associated with nausea and vomiting.  She describes sharp pain of her left flank area that radiates forward to her lower abdomen.  Has history of kidney stones and states current pain feels similar to previous.  Denies any fever or chills.  Review of Systems  Positive: Left flank pain, nausea vomiting Negative: Fever, chills, chest pain shortness of breath  Physical Exam  BP (!) 157/137 (BP Location: Right Arm)   Pulse (!) 128   Temp 98.2 F (36.8 C) (Oral)   Resp (!) 22   Ht 5' 3 (1.6 m)   Wt 65.8 kg   LMP 10/06/2023   SpO2 100%   BMI 25.69 kg/m  Gen:   Awake, no distress   Resp:  Normal effort  MSK:   Moves extremities without difficulty  Other:  Patient appears uncomfortable, actively vomiting during exam  Medical Decision Making  Medically screening exam initiated at 9:07 PM.  Appropriate orders placed.  Ninamarie Keel Grandison was informed that the remainder of the evaluation will be completed by another provider, this initial triage assessment does not replace that evaluation, and the importance of remaining in the ED until their evaluation is complete.     Herlinda Milling, PA-C 10/14/23 2109

## 2023-10-14 NOTE — ED Triage Notes (Signed)
 Pt complaining of left side pain that started around 3pm. Has been vomiting as well since.

## 2023-10-14 NOTE — ED Provider Notes (Signed)
 [place] EMERGENCY DEPARTMENT AT [facility]  Provider Note  CSN: [id number] Arrival date & time: 10/14/23 2033  History Chief Complaint  Patient presents with   Abdominal Pain    Kendra Tate is a 33 y.o. female with history of DM on insulin  pump and prior kidney stone reports onset of left flank pain around 3pm today, gradually worsened and associated with vomiting. She had taken her insulin  pump off to take a shower and her glucose increased some but she reports it's been improving since. She is feeling much better since arrival.    Home Medications Prior to Admission medications   Medication Sig Start Date End Date Taking? Authorizing Provider  escitalopram  (LEXAPRO ) 10 MG tablet Take 10 mg by mouth daily. 07/27/18   [provider]  glucagon 1 MG injection Inject 1 mg into the skin as needed. 09/04/15   [provider]  ibuprofen (ADVIL) 200 MG tablet Take 400 mg by mouth every 6 (six) hours as needed for mild pain.    [provider]  insulin  lispro (HUMALOG) 100 UNIT/ML injection Inject 70 Units into the skin daily. Insulin  pump 09/20/15   [provider]  metoprolol  tartrate (LOPRESSOR ) 25 MG tablet Take 1 tablet (25 mg total) by mouth daily as needed (when HR > 100). 06/07/20   Patt Alm Macho, MD  Multiple Vitamin (MULTIVITAMIN) tablet Take 1 tablet by mouth daily.    [provider]  norethindrone-ethinyl estradiol-iron (ESTROSTEP FE) 1-20/1-30/1-35 MG-MCG tablet Take 1 tablet by mouth daily.    [provider]     Allergies    Patient has no known allergies.   Review of Systems   Review of Systems Please see HPI for pertinent positives and negatives  Physical Exam BP (!) 157/137 (BP Location: Right Arm)   Pulse 97   Temp 98.2 F (36.8 C) (Oral)   Resp (!) 22   Ht 5' 3 (1.6 m)   Wt 65.8 kg   LMP 10/06/2023   SpO2 95%   BMI 25.69 kg/m   Physical Exam Vitals and nursing note reviewed.   Constitutional:      Appearance: Normal appearance.  HENT:     Head: Normocephalic and atraumatic.     Nose: Nose normal.     Mouth/Throat:     Mouth: Mucous membranes are moist.  Eyes:     Extraocular Movements: Extraocular movements intact.     Conjunctiva/sclera: Conjunctivae normal.  Cardiovascular:     Rate and Rhythm: Normal rate.  Pulmonary:     Effort: Pulmonary effort is normal.     Breath sounds: Normal breath sounds.  Abdominal:     General: Abdomen is flat.     Palpations: Abdomen is soft.     Tenderness: There is no abdominal tenderness.  Musculoskeletal:        General: No swelling. Normal range of motion.     Cervical back: Neck supple.  Skin:    General: Skin is warm and dry.  Neurological:     General: No focal deficit present.     Mental Status: She is alert.  Psychiatric:        Mood and Affect: Mood normal.     ED Results / Procedures / Treatments   EKG None  Procedures Procedures  Medications Ordered in the ED Medications  ondansetron  (ZOFRAN ) injection 4 mg (4 mg Intravenous Given 10/14/23 2118)  ketorolac  (TORADOL ) 30 MG/ML injection 30 mg (30 mg Intravenous Given 10/14/23  2118)  HYDROmorphone  (DILAUDID ) injection 0.5 mg (0.5 mg Intravenous Given 10/14/23 2117)    Initial Impression and Plan  Patient here with flank pain consistent with prior renal colic. Resting comfortably at the time of my evaluation after pain medications given in triage. She had labs done showing CBC with mild leukocytosis, CMP with hyperglycemia, but normal anion gap. UA with glucose, but no infection. I personally viewed the images from radiology studies and agree with radiologist interpretation: CT shows a 2mm stone in bladder, no significant hydronephrosis. Suspect she has passed a small renal stone. She is feeling better and ready to go home. Recommend she continue with her usual DM regimen, motrin as needed for pain. PCP follow up, RTED for any other concerns.    ED  Course       MDM Rules/Calculators/A&P Medical Decision Making Problems Addressed: Hyperglycemia: chronic illness or injury with exacerbation, progression, or side effects of treatment Renal colic: acute illness or injury  Amount and/or Complexity of Data Reviewed Labs: ordered. Decision-making details documented in ED Course. Radiology: ordered and independent interpretation performed. Decision-making details documented in ED Course.  Risk Prescription drug management. Parenteral controlled substances.     Final Clinical Impression(s) / ED Diagnoses Final diagnoses:  Renal colic  Hyperglycemia    Rx / DC Orders ED Discharge Orders     None        Roselyn Carlin NOVAK, MD 10/14/23 2325
